# Patient Record
Sex: Female | Born: 1990 | Race: White | Hispanic: No | Marital: Single | Smoking: Never smoker
Health system: Southern US, Community
[De-identification: ages and names within clinical notes are randomized; demographics above are authoritative.]

## PROBLEM LIST (undated history)

## (undated) DIAGNOSIS — G43909 Migraine, unspecified, not intractable, without status migrainosus: Secondary | ICD-10-CM

---

## 2014-09-03 ENCOUNTER — Emergency Department (HOSPITAL_COMMUNITY)
Admission: EM | Admit: 2014-09-03 | Discharge: 2014-09-03 | Disposition: A | Discharge status: Home / Self Care | Payer: BLUE CROSS/BLUE SHIELD | Attending: Emergency Medicine | Admitting: Emergency Medicine

## 2014-09-03 ENCOUNTER — Encounter (HOSPITAL_COMMUNITY): Payer: Self-pay

## 2014-09-03 DIAGNOSIS — R51 Headache: Secondary | ICD-10-CM | POA: Diagnosis present

## 2014-09-03 DIAGNOSIS — H53149 Visual discomfort, unspecified: Secondary | ICD-10-CM | POA: Insufficient documentation

## 2014-09-03 DIAGNOSIS — G43009 Migraine without aura, not intractable, without status migrainosus: Secondary | ICD-10-CM | POA: Diagnosis not present

## 2014-09-03 HISTORY — DX: Migraine, unspecified, not intractable, without status migrainosus: G43.909

## 2014-09-03 LAB — CBC WITH DIFFERENTIAL/PLATELET
BASOS PCT: 0 % (ref 0–1)
Basophils Absolute: 0 10*3/uL (ref 0.0–0.1)
EOS PCT: 2 % (ref 0–5)
Eosinophils Absolute: 0.2 10*3/uL (ref 0.0–0.7)
HCT: 39.1 % (ref 36.0–46.0)
Hemoglobin: 13.1 g/dL (ref 12.0–15.0)
Lymphocytes Relative: 41 % (ref 12–46)
Lymphs Abs: 3.3 10*3/uL (ref 0.7–4.0)
MCH: 27.1 pg (ref 26.0–34.0)
MCHC: 33.5 g/dL (ref 30.0–36.0)
MCV: 81 fL (ref 78.0–100.0)
Monocytes Absolute: 0.7 10*3/uL (ref 0.1–1.0)
Monocytes Relative: 8 % (ref 3–12)
Neutro Abs: 4 10*3/uL (ref 1.7–7.7)
Neutrophils Relative %: 49 % (ref 43–77)
Platelets: 159 10*3/uL (ref 150–400)
RBC: 4.83 MIL/uL (ref 3.87–5.11)
RDW: 12.3 % (ref 11.5–15.5)
WBC: 8 10*3/uL (ref 4.0–10.5)

## 2014-09-03 LAB — BASIC METABOLIC PANEL
ANION GAP: 6 (ref 5–15)
BUN: 10 mg/dL (ref 6–20)
CALCIUM: 8.8 mg/dL — AB (ref 8.9–10.3)
CO2: 24 mmol/L (ref 22–32)
CREATININE: 0.84 mg/dL (ref 0.44–1.00)
Chloride: 107 mmol/L (ref 101–111)
GFR calc Af Amer: >60 mL/min (ref >60)
GFR calc non Af Amer: >60 mL/min (ref >60)
GLUCOSE: 84 mg/dL (ref 65–99)
Potassium: 3.7 mmol/L (ref 3.5–5.1)
Sodium: 137 mmol/L (ref 135–145)

## 2014-09-03 MED ORDER — METHOCARBAMOL 500 MG PO TABS: 500.0000 mg | ORAL_TABLET | Freq: Two times a day (BID) | ORAL | Status: AC

## 2014-09-03 MED ORDER — SODIUM CHLORIDE 0.9 % IV BOLUS (SEPSIS)
1000.0000 mL | Freq: Once | INTRAVENOUS | Status: AC
  Administered 2014-09-03: 1000 mL via INTRAVENOUS

## 2014-09-03 MED ORDER — ONDANSETRON 4 MG PO TBDP: 4.0000 mg | ORAL_TABLET | Freq: Three times a day (TID) | ORAL | Status: AC | PRN

## 2014-09-03 MED ORDER — KETOROLAC TROMETHAMINE 30 MG/ML IJ SOLN
30.0000 mg | Freq: Once | INTRAMUSCULAR | Status: AC
  Administered 2014-09-03: 30 mg via INTRAVENOUS
  Filled 2014-09-03: qty 1

## 2014-09-03 MED ORDER — METOCLOPRAMIDE HCL 5 MG/ML IJ SOLN
10.0000 mg | Freq: Once | INTRAMUSCULAR | Status: AC
  Administered 2014-09-03: 10 mg via INTRAVENOUS
  Filled 2014-09-03: qty 2

## 2014-09-03 MED ORDER — HYDROCODONE-ACETAMINOPHEN 5-325 MG PO TABS
2.0000 | ORAL_TABLET | ORAL | Status: AC | PRN
Start: 2014-09-03 — End: ?

## 2014-09-03 NOTE — ED Provider Notes (Signed)
CSN: [id number]     Arrival date & time 09/03/14  0607 History   First MD Initiated Contact with Patient 09/03/14 [phone]     Chief Complaint  Patient presents with  . Migraine     (Consider location/radiation/quality/duration/timing/severity/associated sxs/prior Treatment) HPI Comments: Patient is a 24 year old female with a past medical history of migraines who presents with a headache for 2 weeks. Patient reports a gradual onset and progressive worsening of the headache. The pain is aching, constant and is located in her right trapezius with radiation up the back of her head. Patient has tried OTC medication for symptoms without relief. No alleviating/aggravating factors. Patient reports associated nausea and photophobia. Patient denies fever, vomiting, diarrhea, numbness/tingling, weakness, visual changes, congestion, chest pain, SOB, abdominal pain.     Patient is a 24 y.o. female presenting with migraines.  Migraine Associated symptoms include headaches. Pertinent negatives include no abdominal pain, arthralgias, chest pain, chills, fatigue, fever, nausea, neck pain, vomiting or weakness.    Past Medical History  Diagnosis Date  . Migraines    History reviewed. No pertinent past surgical history. No family history on file. History  Substance Use Topics  . Smoking status: Never Smoker   . Smokeless tobacco: Never Used  . Alcohol Use: No   OB History    No data available     Review of Systems  Constitutional: Negative for fever, chills and fatigue.  HENT: Negative for trouble swallowing.   Eyes: Negative for visual disturbance.  Respiratory: Negative for shortness of breath.   Cardiovascular: Negative for chest pain and palpitations.  Gastrointestinal: Negative for nausea, vomiting, abdominal pain and diarrhea.  Genitourinary: Negative for dysuria and difficulty urinating.  Musculoskeletal: Negative for arthralgias and neck pain.  Skin: Negative for color change.   Neurological: Positive for headaches. Negative for dizziness and weakness.  Psychiatric/Behavioral: Negative for dysphoric mood.      Allergies  Other  Home Medications   Prior to Admission medications   Medication Sig Start Date End Date Taking? Authorizing Provider  ibuprofen (ADVIL,MOTRIN) 200 MG tablet Take 400 mg by mouth every 6 (six) hours as needed.   Yes Historical Provider, MD   BP 110/90 mmHg  Pulse 88  Temp(Src) 98.7 F (37.1 C)  Resp 12  Ht  (1.626 m)  Wt 160 lb (72.576 kg)  BMI 27.45 kg/m2  SpO2 100%  LMP 09/01/2014 Physical Exam  Constitutional: She is oriented to person, place, and time. She appears well-developed and well-nourished. No distress.  HENT:  Head: Normocephalic and atraumatic.  Mouth/Throat: Oropharynx is clear and moist. No oropharyngeal exudate.  Eyes: Conjunctivae and EOM are normal. Pupils are equal, round, and reactive to light.  Neck: Normal range of motion.  Cardiovascular: Normal rate and regular rhythm.  Exam reveals no gallop and no friction rub.   No murmur heard. Pulmonary/Chest: Effort normal and breath sounds normal. She has no wheezes. She has no rales. She exhibits no tenderness.  Abdominal: Soft. She exhibits no distension. There is no tenderness. There is no rebound.  Musculoskeletal: Normal range of motion.  Neurological: She is alert and oriented to person, place, and time. No cranial nerve deficit. Coordination normal.  Extremity strength and sensation equal and intact bilaterally. Speech is goal-oriented. Moves limbs without ataxia.   Skin: Skin is warm and dry.  Psychiatric: She has a normal mood and affect. Her behavior is normal.  Nursing note and vitals reviewed.   ED Course  Procedures (including critical  care time) Labs Review Labs Reviewed  BASIC METABOLIC PANEL - Abnormal; Notable for the following:    Calcium 8.8 (*)    All other components within normal limits  CBC WITH DIFFERENTIAL/PLATELET     Imaging Review No results found.   EKG Interpretation None      MDM   Final diagnoses:  Migraine without aura and without status migrainosus, not intractable    7:03 AM Labs pending. Patient will have fluids, toradol, reglan. Patient does not want benadryl due to "makes her anxious." Vitals stable and patient afebrile. No neuro deficits.   Patient's headache improved and patient will be discharged.   Emilia BeckKaitlyn Claudy Abdallah, PA-C 09/05/14 [id number]  Doug SouSam Jacubowitz, MD 09/05/14 [phone]

## 2014-09-03 NOTE — ED Notes (Signed)
Pt states that she has hx of migraines, has been getting worse in the past 2 weeks, today is the worst she has ever felt and does not feel like a migraine. Pain on left side of head and neck. C/o nausea, denies photophobia

## 2014-09-03 NOTE — Discharge Instructions (Signed)
Take Vicodin as needed for pain. Take Robaxin as needed for muscle spasm. Take zofran as needed for nausea. Refer to attached documents for more information. [facility] with [facility] as needed.

## 2014-09-03 NOTE — ED Notes (Signed)
Pt. Independently ambulatory to bathroom.

## 2014-10-16 ENCOUNTER — Other Ambulatory Visit: Payer: Self-pay | Admitting: Otolaryngology

## 2014-10-16 ENCOUNTER — Ambulatory Visit
Admission: RE | Admit: 2014-10-16 | Discharge: 2014-10-16 | Disposition: A | Discharge status: Home / Self Care | Payer: BLUE CROSS/BLUE SHIELD | Source: Ambulatory Visit | Attending: Otolaryngology | Admitting: Otolaryngology

## 2014-10-16 DIAGNOSIS — J329 Chronic sinusitis, unspecified: Secondary | ICD-10-CM

## 2015-06-18 ENCOUNTER — Ambulatory Visit: Payer: PRIVATE HEALTH INSURANCE | Attending: Family Medicine

## 2015-06-18 DIAGNOSIS — M62838 Other muscle spasm: Secondary | ICD-10-CM | POA: Diagnosis present

## 2015-06-18 DIAGNOSIS — M545 Low back pain, unspecified: Secondary | ICD-10-CM

## 2015-06-18 DIAGNOSIS — R6889 Other general symptoms and signs: Secondary | ICD-10-CM | POA: Diagnosis present

## 2015-06-18 DIAGNOSIS — M62830 Muscle spasm of back: Secondary | ICD-10-CM | POA: Diagnosis present

## 2015-06-18 NOTE — Therapy (Signed)
[facility] [address] [place], [place], [address] Phone: [phone]   Fax:  [phone]  Physical Therapy Evaluation  Patient Details  Name: Karen Rowe MRN: [id number] Date of Birth: March 17, 1991 Referring Provider: Lanell Persons, MD  Encounter Date: 06/18/2015      PT End of Session - 06/18/15 1422    Visit Number 1   Number of Visits 8   Date for PT Re-Evaluation 07/16/15   Authorization Type Worker compensation   Authorization - Visit Number 1   Authorization - Number of Visits 8   PT Start Time 0135   PT Stop Time 0215   PT Time Calculation (min) 40 min   Activity Tolerance Patient tolerated treatment well;Patient limited by pain   Behavior During Therapy [facility] for tasks assessed/performed      Past Medical History  Diagnosis Date  . Migraines     No past surgical history on file.  There were no vitals filed for this visit.  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    Limitations Sitting  lying down   How long can you sit comfortably? 2 min   How long can you stand comfortably? as needed no pain for 10-15 min.    How long can you walk comfortably? best  20 miin before pain starts.    Diagnostic tests none   Patient Stated Goals No leg pain , return to work.    Currently in Pain? Yes   Pain Score 6    Pain Location Hip  and back   Pain Orientation Right;Left;Posterior   Pain Descriptors / Indicators Throbbing;Tightness   Pain Type Acute pain   Pain Radiating Towards both hips  posterior to anterior thighs to knees   Pain Onset More than a month ago   Pain Frequency Intermittent  AM no pain on waking   Aggravating Factors  sittiing and lying most but can come on after prolonged period s   Pain Relieving Factors changeing positions . heat helps back but increases hip and leg pain   Multiple Pain Sites No            [facility] PT Assessment - 06/18/15 1345    Assessment   Medical Diagnosis lumbar strain   Referring Provider Lanell Persons, MD   Onset Date/Surgical Date --  4 weeks ago   Next MD Visit 07/03/15   Prior Therapy No   Precautions   Precaution Comments She reports light duty   Restrictions   Weight Bearing Restrictions No   Balance Screen   Has the patient fallen in the past 6 months No   Has the patient had a decrease in activity level because of a fear of falling?  Yes   Is the patient reluctant to leave their home because of a fear of falling?  No   Prior Function   Level of Independence Independent   Vocation Full time employment   Vocation Requirements Up to 100 pounds   Cognition   Overall Cognitive  Status Within Functional Limits for tasks assessed   Observation/Other Assessments   Focus on Therapeutic Outcomes (FOTO)  53% limited   ROM / Strength   AROM / PROM / Strength AROM;Strength   AROM   AROM Assessment Site Lumbar   Lumbar Flexion Normal   Lumbar Extension 35   Lumbar - Right Side Bend 32   Lumbar - Left Side Bend 31    Tender sacrum and lower lumbar spine and may have some LT sacral rotation                       PT Education - 06/18/15 1431    Education provided Yes   Education Details POC, HEP   Person(s) Educated Patient   Methods Explanation;Tactile cues;Verbal cues;Handout   Comprehension Returned demonstration;Verbalized understanding             PT Long Term Goals - 06/18/15 1428    PT LONG TERM GOAL #1   Title she will be independent with all HEP issued as of last visit.   Time 4    Period Weeks   Status New   PT LONG TERM GOAL #2   Title She will report leg pain decreased by 50% or more in intensity or frequency.    Time 4   Period Weeks   Status New   PT LONG TERM GOAL #3   Title She will report increased activity at work with minor incr back pain.    Time 4   Period Weeks   Status New   PT LONG TERM GOAL #4   Title She will be able to sit with support for 30 min or more without incr back and leg pain   Time 4   Period Weeks   Status New               Plan - 06/18/15 1423    Clinical Impression Statement Ms Karen Rowe presents withLBP and bilateral hip /buttock pain extneding to anterior thighs to knees. Sitting is most painful position but she cna have pain in back and legs with prolonged positioning in standing and walking . She is best early AM. She is limtied at work as Pharmacist, [employer]. She should improve with skilled PT if paincan stay centralized and she was cautioned to stop activity that cause extremity pain.    Pt will benefit from skilled therapeutic intervention in order to improve on the following deficits Decreased activity tolerance;Pain;Increased muscle spasms   Rehab Potential Good   PT Frequency 2x / week   PT Duration 4 weeks   PT Treatment/Interventions Electrical Stimulation;Iontophoresis 4mg /ml Dexamethasone;Ultrasound;Traction;Moist Heat;Cryotherapy;Therapeutic activities;Therapeutic exercise;Patient/family education;Manual techniques;Passive range of motion;Dry needling;Taping   PT Next Visit Plan REview HEP and progress stabilization exercises. Manual treatment and modalities as needed    PT Home Exercise Plan transverse abdominus, posure sitting with support, childs pose with lateral shift to LT as long as  no leg pain   Consulted and Agree with Plan of Care Patient         Problem List There are no active problems to display for this patient.   Karen Rowe, Karen Rowe  PT 06/18/2015, 2:37 PM  [facility] [address] [place], [place], [address] Phone: [phone]   Fax:  [phone]  Name: Karen Rowe MRN: [id number] Date of Birth: 1990-09-28

## 2015-06-18 NOTE — Patient Instructions (Signed)
From cabinet childs pose 2-3 reps 2-3x/day  10 reps shift to LT with hips , transverese abdominus and pelvic floor stab exercise with knee drop 12-x/day 10-20 reps

## 2015-06-20 ENCOUNTER — Ambulatory Visit: Payer: PRIVATE HEALTH INSURANCE | Attending: Family Medicine | Admitting: Physical Therapy

## 2015-06-20 DIAGNOSIS — M545 Low back pain, unspecified: Secondary | ICD-10-CM

## 2015-06-20 DIAGNOSIS — R6889 Other general symptoms and signs: Secondary | ICD-10-CM | POA: Insufficient documentation

## 2015-06-20 DIAGNOSIS — M62838 Other muscle spasm: Secondary | ICD-10-CM | POA: Diagnosis present

## 2015-06-20 DIAGNOSIS — M62830 Muscle spasm of back: Secondary | ICD-10-CM | POA: Diagnosis present

## 2015-06-20 NOTE — Therapy (Signed)
[facility] [address] [place], [place], [address] Phone: [phone]   Fax:  [phone]  Physical Therapy Treatment  Patient Details  Name: Karen Rowe MRN: [id number] Date of Birth: 18-Feb-1991 Referring Provider: Lanell Persons, MD  Encounter Date: 06/20/2015      PT End of Session - 06/20/15 1828    Visit Number 2   Number of Visits 8   Date for PT Re-Evaluation 07/16/15   PT Start Time 1105   PT Stop Time 1205   PT Time Calculation (min) 60 min   Activity Tolerance Patient tolerated treatment well   Behavior During Therapy [facility] for tasks assessed/performed      Past Medical History  Diagnosis Date  . Migraines     No past surgical history on file.  There were no vitals filed for this visit.  Visit Diagnosis:  Bilateral low back pain without sciatica  Activity intolerance  Muscle spasm of back  Muscle spasms of both lower extremities      Subjective Assessment - 06/20/15 1108    Subjective Hurting more than less visit.  First morning with AM pain first thing.  Less leg pain last night.  Woke up with leg pain 3/10 ,  6/10 now.  Better with sacral stretch in child's pose last night   Currently in Pain? Yes   Pain Score 6    Pain Location Back   Pain Orientation Right;Left;Posterior   Pain Descriptors / Indicators Throbbing                         [facility] Adult PT Treatment/Exercise - 06/20/15 0001    Self-Care   Self-Care --  Quadratus lumborum info,  location, scoot. areas of pain   Lumbar Exercises: Standing   Other Standing Lumbar Exercises Mckenzie lateral shift standing LT toward wall, sidelying RT.  Able to centralize pain, Time also spent discussing corrent exercise program   Cryotherapy   Number Minutes Cryotherapy 15 Minutes   Cryotherapy Location Lumbar Spine   Type of Cryotherapy --  cold pack   Electrical Stimulation   Electrical Stimulation Location lumbar   Electrical Stimulation Action IFC   Electrical Stimulation Parameters 8   Electrical Stimulation Goals Pain   Manual Therapy   Manual therapy comments soft tissue work,  quadratus lumborum strumming,  low back, gluteals.  piriformis etc.  Multipls sore areas.  Light pressure som moderate pressure                PT Education - 06/20/15 1824    Education provided Yes   Education Details Lateral techniques,  quadratus lumborum information.     Person(s) Educated Patient   Methods Explanation;Demonstration;Verbal cues;Handout   Comprehension Verbalized understanding;Returned demonstration             PT Long Term Goals - 06/18/15 1428    PT LONG TERM GOAL #1   Title she will be independent with all HEP issued as of last visit.   Time 4   Period Weeks   Status New   PT LONG TERM GOAL #2   Title She will report leg pain decreased by 50% or more in intensity or frequency.    Time 4   Period Weeks   Status New   PT LONG TERM GOAL #3   Title She will report increased activity at work with minor incr back pain.    Time 4   Period Weeks  Status New   PT LONG TERM GOAL #4   Title She will be able to sit with support for 30 min or more without incr back and leg pain   Time 4   Period Weeks   Status New               Plan - 06/20/15 1829    Clinical Impression Statement able to centralize pain with lateral shifting.  Sensitive areas multiple to soft tissue work.     PT Next Visit Plan when spine neutral stabilization.  Dry needle in future.  Manual and modalities.  ADL handout   PT Home Exercise Plan Lateral stretch   Consulted and Agree with Plan of Care Patient        Problem List There are no active problems to display for this patient.   [facility],KAREN 06/20/2015, 6:32 PM  [facility] [address] [place], [place], [address] Phone: [phone]   Fax:  [phone]  Name: Nils PyleJulia Payer MRN:  [id number] Date of Birth: 1990-12-07    Liz BeachKaren Harris, PTA 06/20/2015 6:32 PM Phone: [phone] Fax: [phone]

## 2015-06-20 NOTE — Patient Instructions (Signed)
From drawer:  Mckenzie lateral technique  3-5 X 10 seconds + longer as needed to correct posture.1st and last issued.

## 2015-07-01 ENCOUNTER — Ambulatory Visit: Payer: PRIVATE HEALTH INSURANCE | Admitting: Physical Therapy

## 2015-07-01 DIAGNOSIS — M545 Low back pain, unspecified: Secondary | ICD-10-CM

## 2015-07-01 DIAGNOSIS — M62830 Muscle spasm of back: Secondary | ICD-10-CM

## 2015-07-01 DIAGNOSIS — R6889 Other general symptoms and signs: Secondary | ICD-10-CM

## 2015-07-01 DIAGNOSIS — M62838 Other muscle spasm: Secondary | ICD-10-CM

## 2015-07-01 NOTE — Patient Instructions (Addendum)
Trigger Point Dry Needling  . What is Trigger Point Dry Needling (DN)? o DN is a physical therapy technique used to treat muscle pain and dysfunction. Specifically, DN helps deactivate muscle trigger points (muscle knots).  o A thin filiform needle is used to penetrate the skin and stimulate the underlying trigger point. The goal is for a local twitch response (LTR) to occur and for the trigger point to relax. No medication of any kind is injected during the procedure.   . What Does Trigger Point Dry Needling Feel Like?  o The procedure feels different for each individual patient. Some patients report that they do not actually feel the needle enter the skin and overall the process is not painful. Very mild bleeding may occur. However, many patients feel a deep cramping in the muscle in which the needle was inserted. This is the local twitch response.   Marland Kitchen. How Will I feel after the treatment? o Soreness is normal, and the onset of soreness may not occur for a few hours. Typically this soreness does not last longer than two days.  o Bruising is uncommon, however; ice can be used to decrease any possible bruising.  o In rare cases feeling tired or nauseous after the treatment is normal. In addition, your symptoms may get worse before they get better, this period will typically not last longer than 24 hours.   . What Can I do After My Treatment? o Increase your hydration by drinking more water for the next 24 hours. o You may place ice or heat on the areas treated that have become sore, however, do not use heat on inflamed or bruised areas. Heat often brings more relief post needling. o You can continue your regular activities, but vigorous activity is not recommended initially after the treatment for 24 hours. o DN is best combined with other physical therapy such as strengthening, stretching, and other therapies.   Piriformis (Supine)  Cross legs, right on top. Gently pull other knee toward chest  until stretch is felt in buttock/hip of top leg. Modified by placing right knee over left knee and bringing both to chest as shown in clinic( not right ankle over left knee NO Hold _30-60- ___ seconds. Repeat 2-3____ times per set. Do _1___ sets per session. Do _2-3___ sessions per day.  Hip Stretch  Put right ankle over left knee. Let right knee fall downward, but keep ankle in place. Feel the stretch in hip. May push down gently with hand to feel stretch. Hold 30-60____ seconds while counting out loud. Repeat with other leg. Repeat _2-3___ times. Do __2-3__ sessions per day. Total a day.          Garen LahLawrie Latrina Guttman, PT 07/01/2015 1:41 PM Phone: [phone] Fax: [phone]

## 2015-07-01 NOTE — Therapy (Signed)
[facility] [address] [place], [place], [address] Phone: [phone]   Fax:  [phone]  Physical Therapy Treatment  Patient Details  Name: Karen Rowe MRN: [id number] Date of Birth: 1990/04/29 Referring Provider: Lanell Persons, MD  Encounter Date: 07/01/2015      PT End of Session - 07/01/15 1415    Visit Number 3   Number of Visits 8   Date for PT Re-Evaluation 07/16/15   Authorization Type Worker compensation   PT Start Time 0135   PT Stop Time 0230   PT Time Calculation (min) 55 min   Activity Tolerance Patient tolerated treatment well   Behavior During Therapy [facility] for tasks assessed/performed      Past Medical History  Diagnosis Date  . Migraines     No past surgical history on file.  There were no vitals filed for this visit.  Visit Diagnosis:  Bilateral low back pain without sciatica  Activity intolerance  Muscle spasm of back  Muscle spasms of both lower extremities      Subjective Assessment - 07/01/15 1333    Subjective I really like the lateral shifting exericises really help me.  I am interested in the trigger point dry needling.  A day after my PT session my back pain was consistenly better in the back but today my right leg pain in worse   Limitations Sitting   How long can you sit comfortably? 10 minutes   How long can you stand comfortably? 20-30 minutes less pain   How long can you walk comfortably? best  20 miin before pain starts.    Diagnostic tests none   Patient Stated Goals No leg pain , return to work.    Currently in Pain? Yes   Pain Score 2    Pain Location Back   Pain Orientation Right;Posterior   Pain Descriptors / Indicators Tightness   Pain Type Chronic pain   Pain Onset More than a month ago   Pain Frequency Intermittent   Pain Score 5  yesterday is a 10/10   Pain Location Leg   Pain Descriptors / Indicators Aching;Radiating   Pain Type Chronic pain   Pain  Onset More than a month ago   Pain Frequency Intermittent            [facility] PT Assessment - 07/01/15 1344    Posture/Postural Control   Posture/Postural Control Postural limitations   Posture Comments Pt with eleveated pelvic level on right and anterior rotation. on right   Palpation   Palpation comment trigger point pain over Right quadratus, glueteal line and piriformis                     [facility] Adult PT Treatment/Exercise - 07/01/15 1344    Self-Care   Self-Care Other Self-Care Comments   Other Self-Care Comments  use of LAX ball for self myofascial stretch and explanaition of dry needling , aftercare and precautians   Lumbar Exercises: Stretches   Piriformis Stretch 3 reps;30 seconds  sitting stretch   Piriformis Stretch Limitations 2 reps in supine with right knee crossed over left   Lumbar Exercises: Sidelying   Other Sidelying Lumbar Exercises left sidelying quadratus lumborum with pillow under torso for 5 min    Moist Heat Therapy   Number Minutes Moist Heat 15 Minutes   Moist Heat Location Hip;Lumbar Spine  right quadratus and right piriformis   Manual Therapy   Manual Therapy Soft tissue mobilization;Myofascial release;Passive  ROM   Soft tissue mobilization Piriformis and Gluteals on right with IASTYM tool    Myofascial Release Right quadratus lumborum in left sidelying   Passive ROM prone assisted stretch with myofascial release of Piriformis  wit IIR/ER stretch at end range of Right           Trigger Point Dry Needling - 07/01/15 1418    Consent Given? Yes   Education Handout Provided Yes   Muscles Treated Lower Body Gluteus maximus;Gluteus minimus;Piriformis  quadratus Lumborum right twitch response   Gluteus Maximus Response Twitch response elicited;Palpable increased muscle length   Gluteus Minimus Response Twitch response elicited;Palpable increased muscle length   Piriformis Response Twitch response elicited;Palpable increased muscle length               PT Education - 07/01/15 1342    Education provided Yes   Education Details Qaudratus lumborum stretch review, piriformis stretch modification and trigger point dry needling    Person(s) Educated Patient   Methods Explanation;Demonstration;Verbal cues   Comprehension Verbalized understanding;Returned demonstration;Tactile cues required;Verbal cues required             PT Long Term Goals - 07/01/15 1641    PT LONG TERM GOAL #1   Title she will be independent with all HEP issued as of last visit.   Time 4   Period Weeks   Status On-going   PT LONG TERM GOAL #2   Title She will report leg pain decreased by 50% or more in intensity or frequency.    Time 4   Period Weeks   Status On-going   PT LONG TERM GOAL #3   Title She will report increased activity at work with minor incr back pain.    Time 4   Period Weeks   Status On-going   PT LONG TERM GOAL #4   Title She will be able to sit with support for 30 min or more without incr back and leg pain   Time 4   Period Weeks   Status On-going               Plan - 07/01/15 1417    Clinical Impression Statement Pt presents with right Quadratus Lumborum and right piriformis trigger point pain and consents to trigger poing dry needling . Pt also given instruction about using LAX ball for piriformis stretch on Rgith side. Pt was able to perform Piriformis stretch before and after dry needling.  Pt was monitored throughtout dry needling session.  Pt was able to perform  piriformins stretch with 50% decreased  discomfort.  Pt will  be assessed next  session for long term decrease in pain.  Pt was able to verbalize aftercare and precautians for trigger point dry needling.     Pt will benefit from skilled therapeutic intervention in order to improve on the following deficits Decreased activity tolerance;Pain;Increased muscle spasms   Rehab Potential Good   PT Frequency 2x / week   PT Duration 4 weeks   PT  Treatment/Interventions Electrical Stimulation;Iontophoresis /ml Dexamethasone;Ultrasound;Traction;Moist Heat;Cryotherapy;Therapeutic activities;Therapeutic exercise;Patient/family education;Manual techniques;Passive range of motion;Dry needling;Taping   PT Next Visit Plan Assess effectiveness of dry needling, assess FABER and pain add to HEP as necessary, modalities as needed   PT Home Exercise Plan Quadratus lumborum , Modified Piriformis stretch.   Consulted and Agree with Plan of Care Patient        Problem List There are no active problems to display for this patient.  Garen Lah, PT  07/01/2015 4:44 PM Phone: [phone] Fax: [phone]  [facility] [address] [place], [place], [address] Phone: [phone]   Fax:  [phone]  Name: Karen Rowe MRN: [id number] Date of Birth: Oct 21, 1990

## 2015-07-03 ENCOUNTER — Ambulatory Visit: Payer: PRIVATE HEALTH INSURANCE | Admitting: Physical Therapy

## 2015-07-03 DIAGNOSIS — M545 Low back pain, unspecified: Secondary | ICD-10-CM

## 2015-07-03 DIAGNOSIS — M62838 Other muscle spasm: Secondary | ICD-10-CM

## 2015-07-03 DIAGNOSIS — M62830 Muscle spasm of back: Secondary | ICD-10-CM

## 2015-07-03 DIAGNOSIS — R6889 Other general symptoms and signs: Secondary | ICD-10-CM

## 2015-07-03 NOTE — Therapy (Signed)
[facility] [address] [place], [place], [address] Phone: [phone]   Fax:  [phone]  Physical Therapy Treatment  Patient Details  Name: Karen PyleJulia Rowe MRN: [id number] Date of Birth: 1990-06-12 Referring Provider: Lanell Personshomas Kingsley, MD  Encounter Date: 07/03/2015      PT End of Session - 07/03/15 0944    Visit Number 4   Number of Visits 8   Date for PT Re-Evaluation 07/16/15   PT Start Time 0852   PT Stop Time 0935   PT Time Calculation (min) 43 min   Activity Tolerance Patient tolerated treatment well   Behavior During Therapy [facility] for tasks assessed/performed      Past Medical History  Diagnosis Date  . Migraines     No past surgical history on file.  There were no vitals filed for this visit.  Visit Diagnosis:  Bilateral low back pain without sciatica  Activity intolerance  Muscle spasm of back  Muscle spasms of both lower extremities      Subjective Assessment - 07/03/15 0854    Subjective DN  Helped her sit longer  Can sit 10 minutes. ,  and it did help the soreness in legs for 1 day and most of yesterday,     Currently in Pain? Yes   Pain Score 3    Pain Location Back   Pain Orientation Right   Pain Descriptors / Indicators Tightness;Throbbing   Pain Radiating Towards RT leg mild   Aggravating Factors  sitting more than 10 minutes.                          [facility] Adult PT Treatment/Exercise - 07/03/15 0001    Posture/Postural Control   Posture Comments slight anterior rotation RT.    Lumbar Exercises: Stretches   Passive Hamstring Stretch 3 reps;30 seconds   Passive Hamstring Stretch Limitations BOTH  80 degrees RT,  LT  90   Piriformis Stretch 3 reps  20 seconds  sitting.    Lumbar Exercises: Supine   Clam Limitations isometric with belt 10 X,, also ball squeeze 10 X   Bridge 10 reps  one bone at a time helped pain   Isometric Hip Flexion 10 reps  arm Rt/LT pushing ball    Large Ball Oblique Isometric 10 reps  each   Manual Therapy   Manual Therapy Soft tissue mobilization   Soft tissue mobilization Piriformis and Gluteals on right with IASTYM tool   han hands used intermittantly.  Mid back tender paraspinals    Myofascial Release Right quadratus lumborum in left sidelying  and paraspinals                PT Education - 07/03/15 0943    Education provided Yes   Education Details Bridge, hamstring stretch   Person(s) Educated Patient   Methods Explanation;Handout;Tactile cues;Verbal cues   Comprehension Verbalized understanding;Returned demonstration             PT Long Term Goals - 07/01/15 1641    PT LONG TERM GOAL #1   Title she will be independent with all HEP issued as of last visit.   Time 4   Period Weeks   Status On-going   PT LONG TERM GOAL #2   Title She will report leg pain decreased by 50% or more in intensity or frequency.    Time 4   Period Weeks   Status On-going   PT LONG TERM GOAL #  3   Title She will report increased activity at work with minor incr back pain.    Time 4   Period Weeks   Status On-going   PT LONG TERM GOAL #4   Title She will be able to sit with support for 30 min or more without incr back and leg pain   Time 4   Period Weeks   Status On-going               Plan - 07/03/15 0944    Clinical Impression Statement Patient has a period of time since last few days where she had no pain.  "It was a miracle".   She relates [place] been painful.  She has an unstable feeling in SI when standing single leg on RT only.   Was able to add 2 new home exercises  today.  Pain 2/10 at end of session.    PT Next Visit Plan assess FABER and pain , stabilization exercise progression.  Check for IT band tightness,  calf stretch.  Cat Camel?   PT Home Exercise Plan hamstring stretch, bridge   Consulted and Agree with Plan of Care Patient        Problem List There are no  active problems to display for this patient.   [facility] 07/03/2015, 9:50 AM  [facility] [address] [place], [place], [address] Phone: [phone]   Fax:  [phone]  Name: Karen Rowe MRN: [id number] Date of Birth: 10/20/1990    Liz Beach, PTA 07/03/2015 9:50 AM Phone: [phone] Fax: [phone]

## 2015-07-03 NOTE — Patient Instructions (Addendum)
Hamstring: Towel Stretch (Supine)    Lie on back. Loop towel around left foot, hip and knee at 90. Straighten knee and pull foot toward body. Hold __30_ seconds. Relax. Repeat 3___ times. Do _1Bridge    Lie back, legs bent. Inhale, pressing hips up. Keeping ribs in, lengthen lower back. Exhale, rolling down along spine from top. Repeat _10_-20__ times. Do _1___ sessions per day.  http://pm.exer.us/55   Copyright  VHI. All rights reserved.  _1_ times a day. Repeat with other leg.    Copyright  VHI. All rights reserved.

## 2015-07-08 ENCOUNTER — Ambulatory Visit: Payer: PRIVATE HEALTH INSURANCE | Admitting: Physical Therapy

## 2015-07-08 DIAGNOSIS — M62838 Other muscle spasm: Secondary | ICD-10-CM

## 2015-07-08 DIAGNOSIS — R6889 Other general symptoms and signs: Secondary | ICD-10-CM

## 2015-07-08 DIAGNOSIS — M62830 Muscle spasm of back: Secondary | ICD-10-CM

## 2015-07-08 DIAGNOSIS — M545 Low back pain, unspecified: Secondary | ICD-10-CM

## 2015-07-08 NOTE — Therapy (Signed)
[place], [place], [address] Phone: [phone]   Fax:  [phone]  Physical Therapy Treatment  Patient Details  Name: Karen Rowe MRN: [id number] Date of Birth: 02-15-1991 Referring Provider: Odis Luster, MD  Encounter Date: 07/08/2015      PT End of Session - 07/08/15 0907    Visit Number 5   Number of Visits 8   Date for PT Re-Evaluation 07/16/15   Authorization Type Worker compensation   Authorization - Visit Number 5   Authorization - Number of Visits 8   PT Start Time 9150   PT Stop Time 0946   PT Time Calculation (min) 53 min      Past Medical History  Diagnosis Date  . Migraines     No past surgical history on file.  There were no vitals filed for this visit.  Visit Diagnosis:  Bilateral low back pain without sciatica  Activity intolerance  Muscle spasm of back  Muscle spasms of both lower extremities      Subjective Assessment - 07/08/15 0854    Subjective Back pain was worse after last session but leg pain was better. Long car ride over the weekend 3.5 hours each way aggravated the leg pain.    Currently in Pain? Yes   Pain Score 2    Pain Location Buttocks   Pain Orientation Right   Pain Descriptors / Indicators --  someone is jabbing and turning something   Pain Radiating Towards rt anterior thigh   Aggravating Factors  sitting                         [facility] Adult PT Treatment/Exercise - 07/08/15 0001    Lumbar Exercises: Stretches   Active Hamstring Stretch 3 reps;30 seconds   Active Hamstring Stretch Limitations with strap   Piriformis Stretch 3 reps;30 seconds   Piriformis Stretch Limitations figure four post manual   Moist Heat Therapy   Number Minutes Moist Heat 15 Minutes   Moist Heat Location Hip;Lumbar Spine  right quadratus and right piriformis   Manual Therapy   Manual Therapy Soft tissue mobilization   Soft tissue mobilization  Piriformis and sacral border right with PROM Hip IR/ ER                     PT Long Term Goals - 07/08/15 0908    PT LONG TERM GOAL #1   Title she will be independent with all HEP issued as of last visit.   Time 4   Period Weeks   PT LONG TERM GOAL #2   Title She will report leg pain decreased by 50% or more in intensity or frequency.    Baseline 20%   Time 4   Period Weeks   Status On-going   PT LONG TERM GOAL #3   Title She will report increased activity at work with minor incr back pain.    Time 4   Period Weeks   Status Achieved   PT LONG TERM GOAL #4   Title She will be able to sit with support for 30 min or more without incr back and leg pain   Baseline 10 minutes, some days less   Time 4   Period Weeks   Status On-going               Plan - 07/08/15 0910    Clinical Impression Statement Pt would like to  try TPDN one more visit. Scheduled pt for next week. She may benefit from additional visits. Instructed pt in piriformis, hamstring and cat /camel stretching per last visit plan. Pt reports a 20 % overall decrease in lumbar and leg pain. She is limited to 10 minutes of sitting prior to increased pain. She can report an increase in activity at work with only mild increase in lumbar and back pain.LTG#3 Met. Manual TPR used  in prone to lengthen piriformis as well as active release using Hip IR/AROM PROM Rt. Pt able to tolerate figure 4 stretching more comfortably post session.    PT Next Visit Plan REQUEST MORE VISITS? assess FABER and pain , stabilization exercise progression.  Check for IT band tightness, review hamstring stretch, add calf stretch?         Problem List There are no active problems to display for this patient.   Hessie Diener Zena, [place] 07/08/2015, 9:58 AM  [place], [place], [address] Phone: [phone]   Fax:  [phone]  Name: Karen Rowe MRN:  [id number] Date of Birth: Dec 02, 1990

## 2015-07-10 ENCOUNTER — Ambulatory Visit: Payer: PRIVATE HEALTH INSURANCE | Admitting: Physical Therapy

## 2015-07-10 DIAGNOSIS — M545 Low back pain, unspecified: Secondary | ICD-10-CM

## 2015-07-10 DIAGNOSIS — M62838 Other muscle spasm: Secondary | ICD-10-CM

## 2015-07-10 DIAGNOSIS — M62830 Muscle spasm of back: Secondary | ICD-10-CM

## 2015-07-10 DIAGNOSIS — R6889 Other general symptoms and signs: Secondary | ICD-10-CM

## 2015-07-10 NOTE — Therapy (Signed)
[place], [place], [address] Phone: [phone]   Fax:  [phone]  Physical Therapy Treatment  Patient Details  Name: Karen Rowe MRN: [id number] Date of Birth: 1990/08/08 Referring Provider: Odis Luster, MD  Encounter Date: 07/10/2015      PT End of Session - 07/10/15 0927    Visit Number 6   Number of Visits 8   Date for PT Re-Evaluation 07/16/15   Authorization - Visit Number 6   Authorization - Number of Visits 8   PT Start Time 7867   PT Stop Time 0937   PT Time Calculation (min) 42 min   Activity Tolerance Patient tolerated treatment well   Behavior During Therapy [facility] for tasks assessed/performed      Past Medical History  Diagnosis Date  . Migraines     No past surgical history on file.  There were no vitals filed for this visit.  Visit Diagnosis:  Bilateral low back pain without sciatica  Activity intolerance  Muscle spasm of back  Muscle spasms of both lower extremities      Subjective Assessment - 07/10/15 0856    Subjective 6/10.  Helped patient yesterday with arms outstretched. and felt a small twinge and then it progressed. Did not sleep with 9/10 pain.  Both legs to back.  Past knee. I feel like my posture is getting better   Currently in Pain? Yes   Pain Score 6   up to 9/10   Pain Location Back   Pain Orientation Right;Left   Pain Descriptors / Indicators Tingling;Numbness  paraesthesia wierd, new below knees.   Pain Radiating Towards both legs below knees   Pain Frequency Constant   Aggravating Factors  helping patient   Pain Relieving Factors quadratus stretch a little                         [facility] Adult PT Treatment/Exercise - 07/10/15 0903    Lumbar Exercises: Aerobic   Tread Mill 2.5 minutes prior to pain down RT leg increased.   Rt leg felt like it was working harder than LT   Traction   Type of Traction Lumbar   Min (lbs) 20   Max (lbs) 50   Hold Time Protocol for disc involvement with guarding   Time 17                     PT Long Term Goals - 07/08/15 0908    PT LONG TERM GOAL #1   Title she will be independent with all HEP issued as of last visit.   Time 4   Period Weeks   PT LONG TERM GOAL #2   Title She will report leg pain decreased by 50% or more in intensity or frequency.    Baseline 20%   Time 4   Period Weeks   Status On-going   PT LONG TERM GOAL #3   Title She will report increased activity at work with minor incr back pain.    Time 4   Period Weeks   Status Achieved   PT LONG TERM GOAL #4   Title She will be able to sit with support for 30 min or more without incr back and leg pain   Baseline 10 minutes, some days less   Time 4   Period Weeks   Status On-going  Plan - 07/10/15 0928    Clinical Impression Statement Pain into both legs now below knees which is new , see subjective.   PT Pearson Forster was  informed for worsening so he discussed Return to MD.  Trial of traction. No pain goals met.   PT Next Visit Plan REQUEST MORE VISITS? assess FABER and pain , stabilization exercise progression.  Check for IT band tightness, review hamstring stretch, add calf stretch?    PT Home Exercise Plan No new   Consulted and Agree with Plan of Care Patient        Problem List There are no active problems to display for this patient.   [facility] 07/10/2015, 1:19 PM  [facility] [address] [place], [place], [address] Phone: [phone]   Fax:  [phone]  Name: Karen Rowe MRN: [id number] Date of Birth: [birth year]    Melvenia Needles, PTA 07/10/2015 1:19 PM Phone: [phone] Fax: [phone]

## 2015-07-17 ENCOUNTER — Ambulatory Visit: Payer: PRIVATE HEALTH INSURANCE | Attending: Family Medicine

## 2015-07-17 DIAGNOSIS — R252 Cramp and spasm: Secondary | ICD-10-CM | POA: Insufficient documentation

## 2015-07-17 DIAGNOSIS — M5442 Lumbago with sciatica, left side: Secondary | ICD-10-CM | POA: Insufficient documentation

## 2015-07-17 DIAGNOSIS — M5441 Lumbago with sciatica, right side: Secondary | ICD-10-CM | POA: Insufficient documentation

## 2015-07-17 NOTE — Therapy (Addendum)
[place], [place], [address] Phone: [phone]   Fax:  [phone]  Physical Therapy Treatment  Patient Details  Name: Karen Rowe MRN: [id number] Date of Birth: May 28, 1990 Referring Provider: Odis Luster, MD  Encounter Date: 07/17/2015      PT End of Session - 07/17/15 0909    Visit Number 7   Number of Visits 12   Date for PT Re-Evaluation 08/09/15   PT Start Time 3794   PT Stop Time 0930   PT Time Calculation (min) 43 min   Activity Tolerance Patient tolerated treatment well;No increased pain   Behavior During Therapy [facility] for tasks assessed/performed      Past Medical History  Diagnosis Date  . Migraines     No past surgical history on file.  There were no vitals filed for this visit.  Visit Diagnosis:  Bilateral low back pain with sciatica, sciatica laterality unspecified - Plan: PT plan of care cert/re-cert  Cramp and spasm - Plan: PT plan of care cert/re-cert      Subjective Assessment - 07/17/15 0850    Subjective 8/10 back and both leg RT to foot and LT to knee. We discussed what parts of PT were beneficial and she reported traction makes back better but legs unchanged and dry needling gave most benefit. She saw MD this week and he wanted 2 more weeks of PT and make descision of next step then.    Currently in Pain? Yes   Pain Score 8    Pain Location Back   Pain Orientation Right;Left;Lower;Posterior   Pain Descriptors / Indicators Tingling;Numbness   Pain Type Acute pain   Pain Radiating Towards both legs   Pain Onset More than a month ago   Pain Frequency Constant   Multiple Pain Sites No                         [facility] Adult PT Treatment/Exercise - 07/17/15 0001    Traction   Type of Traction Lumbar   Min (lbs) 25   Max (lbs) 75   Hold Time Protocol for disc involvement with guarding   Time 17   Manual Therapy   Passive ROM hamstring stretch x 2  RT and LT without decr symptoms in leg No increase either.      HMP to lower back at end                PT Long Term Goals - 07/17/15 0915    PT LONG TERM GOAL #1   Title she will be independent with all HEP issued as of last visit.   Status On-going   PT LONG TERM GOAL #2   Title She will report leg pain decreased by 50% or more in intensity or frequency.    Status On-going   PT LONG TERM GOAL #3   Title She will report increased activity at work with minor incr back pain.    Status Achieved   PT LONG TERM GOAL #4   Title She will be able to sit with support for 30 min or more without incr back and leg pain   Status On-going               Plan - 07/17/15 0909    Clinical Impression Statement It appears dry needling has had most benefit. She is consistent per her report in doing exercise s/posture/ work tasks. Traction helps back pain but  not leg pain.    Pt will benefit from skilled therapeutic intervention in order to improve on the following deficits Pain;Decreased activity tolerance;Increased muscle spasms  bilateral LBP with sciatic unspecified, cramp and spasm   PT Next Visit Plan Dry needling and traction for 3-4 more visits  then return to MD  progress stabilization if appropriate   Consulted and Agree with Plan of Care Patient        Problem List There are no active problems to display for this patient.   Darrel Hoover PT 07/17/2015, 9:18 AM  [facility] [address] [place], [place], [address] Phone: [phone]   Fax:  [phone]  Name: Karen Rowe MRN: [id number] Date of Birth: 1990/06/03    PHYSICAL THERAPY DISCHARGE SUMMARY  Visits from Start of Care: 7  Current functional level related to goals / functional outcomes: See above   Remaining deficits: Unknown a she has not returned and it appears she saw MD 07/22/15   Education / Equipment: HEP Plan:                                                     Patient goals were partially met. Patient is being discharged due to not returning since the last visit.  ?????   Darrel Hoover, PT                 08/22/15     4:17PM

## 2015-07-18 ENCOUNTER — Ambulatory Visit: Payer: PRIVATE HEALTH INSURANCE

## 2015-08-08 IMAGING — CT CT PARANASAL SINUSES LIMITED
1 series · 8 of 10 positions shown, 10 images · non-contrast
Comparison: None.

CLINICAL DATA: Sinus pressure on the right. Congestion. Deviated
septum. Recent conclusion of antibiotic therapy.

EXAM:
CT PARANASAL SINUS LIMITED WITHOUT CONTRAST
TECHNIQUE: Non-contiguous multidetector CT images of the paranasal sinuses were
obtained in a single plane without contrast.

[Series 3: coronal soft · axial · 0.33mm/px · z∈[+8,+78]mm · 8 of 10 slices shown, 10 images]
[im 2/10  brain]
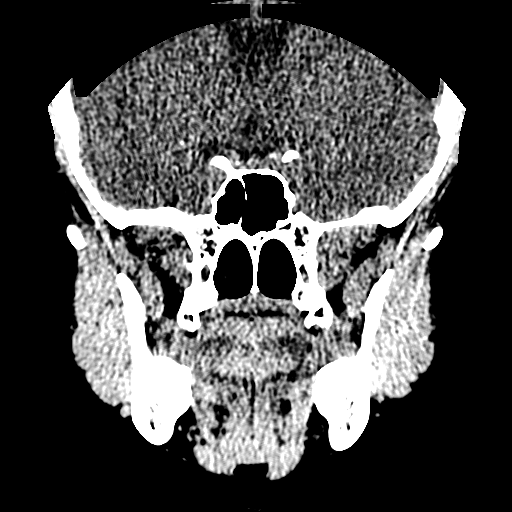
[im 2/10  bone]
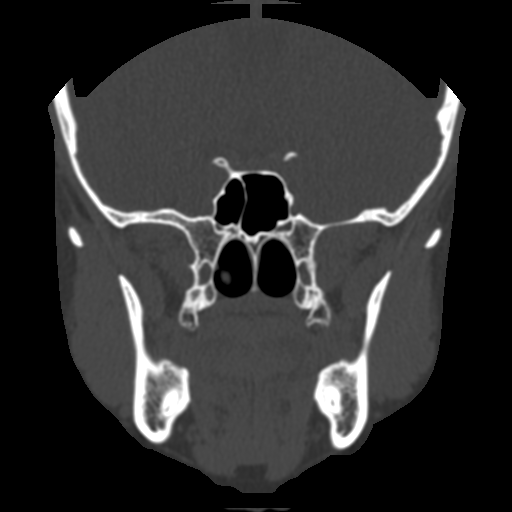
[im 3/10  bone]
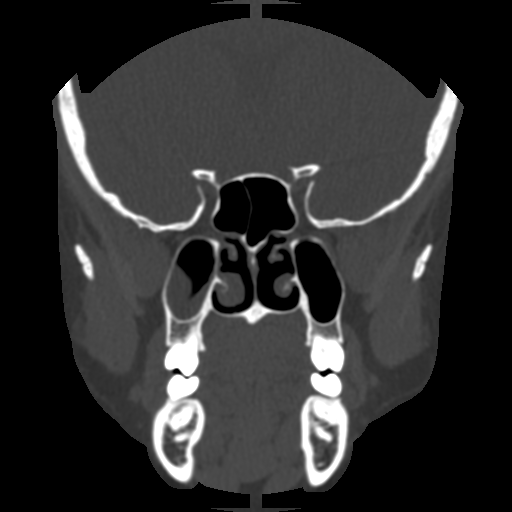
[im 4/10  bone]
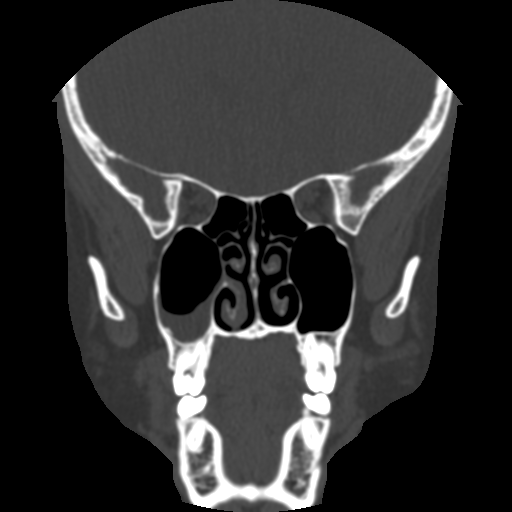
[im 5/10  bone]
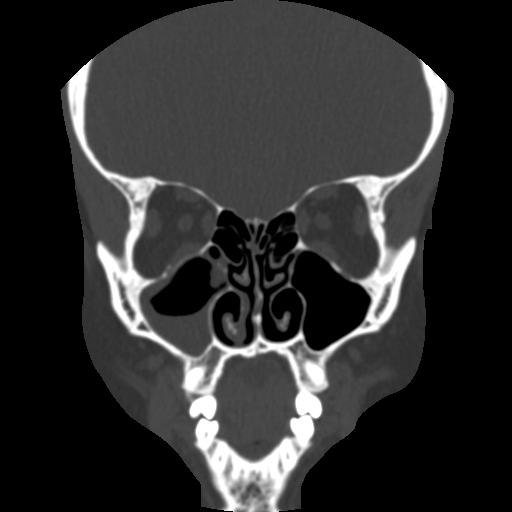
[im 6/10  brain]
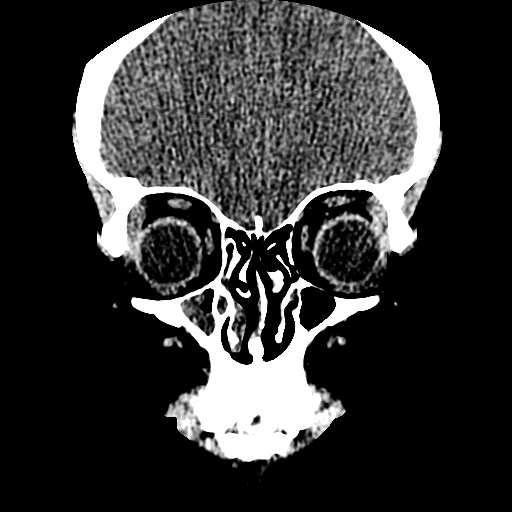
[im 6/10  bone]
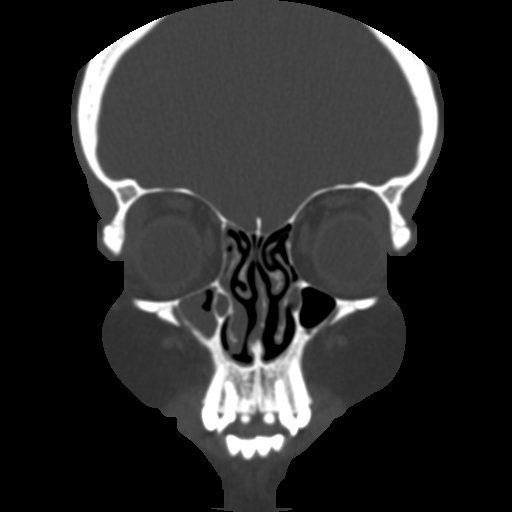
[im 7/10  bone]
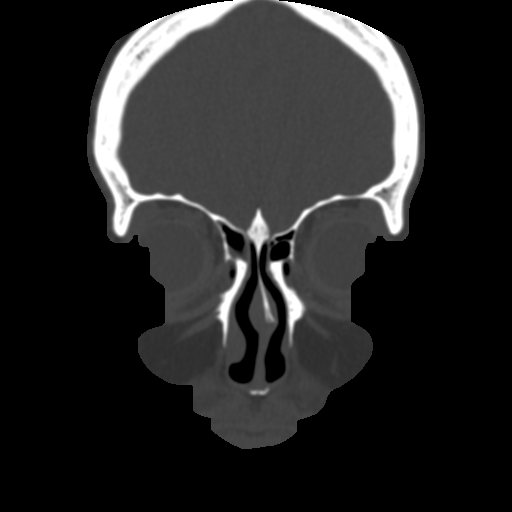
[im 8/10  bone]
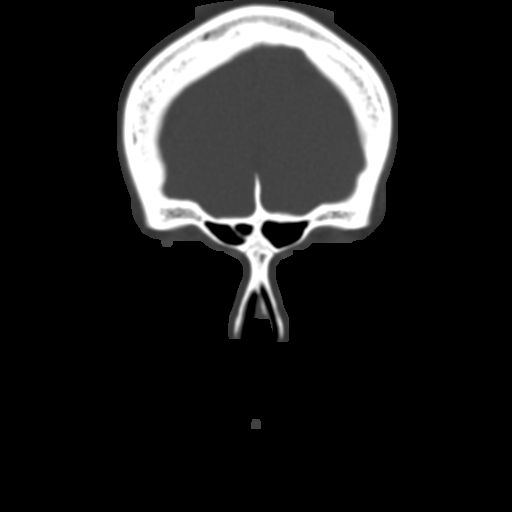
[im 9/10  bone]
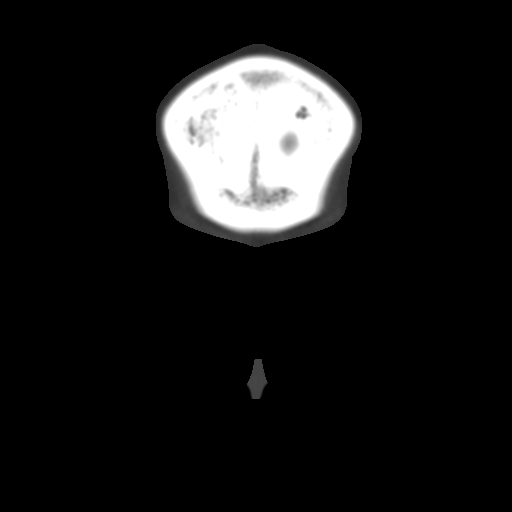

[8 of 10 positions shown; findings below may reference images not displayed]

FINDINGS: Moderate chronic left maxillary sinusitis. Mild nasal mucosal
swelling, left greater than right. Up to 4 mm rightward nasal septal
deviation anteriorly.
IMPRESSION: 1. Chronic left maxillary sinusitis.
2. Mild nasal mucosal swelling. There is up to 4 mm rightward nasal
septal deviation anteriorly.

## 2015-08-21 ENCOUNTER — Other Ambulatory Visit: Payer: Self-pay | Admitting: Occupational Medicine

## 2015-08-21 ENCOUNTER — Ambulatory Visit: Payer: Self-pay

## 2015-08-21 DIAGNOSIS — M549 Dorsalgia, unspecified: Secondary | ICD-10-CM

## 2015-09-02 ENCOUNTER — Other Ambulatory Visit (HOSPITAL_COMMUNITY): Payer: Self-pay | Admitting: Orthopedic Surgery

## 2015-09-02 DIAGNOSIS — M5441 Lumbago with sciatica, right side: Secondary | ICD-10-CM

## 2015-09-03 ENCOUNTER — Ambulatory Visit (HOSPITAL_COMMUNITY)
Admission: RE | Admit: 2015-09-03 | Discharge: 2015-09-03 | Disposition: A | Discharge status: Home / Self Care | Payer: BLUE CROSS/BLUE SHIELD | Source: Ambulatory Visit | Attending: Orthopedic Surgery | Admitting: Orthopedic Surgery

## 2015-09-03 DIAGNOSIS — M5441 Lumbago with sciatica, right side: Secondary | ICD-10-CM

## 2015-09-03 DIAGNOSIS — M545 Low back pain: Secondary | ICD-10-CM | POA: Insufficient documentation

## 2015-09-17 ENCOUNTER — Other Ambulatory Visit: Payer: Self-pay | Admitting: Orthopedic Surgery

## 2015-09-17 DIAGNOSIS — M533 Sacrococcygeal disorders, not elsewhere classified: Secondary | ICD-10-CM

## 2015-09-30 ENCOUNTER — Ambulatory Visit
Admission: RE | Admit: 2015-09-30 | Discharge: 2015-09-30 | Disposition: A | Discharge status: Home / Self Care | Payer: Worker's Compensation | Source: Ambulatory Visit | Attending: Orthopedic Surgery | Admitting: Orthopedic Surgery

## 2015-09-30 DIAGNOSIS — M533 Sacrococcygeal disorders, not elsewhere classified: Secondary | ICD-10-CM

## 2015-10-10 ENCOUNTER — Other Ambulatory Visit: Payer: Self-pay | Admitting: Orthopedic Surgery

## 2015-10-10 DIAGNOSIS — M533 Sacrococcygeal disorders, not elsewhere classified: Secondary | ICD-10-CM

## 2015-10-21 ENCOUNTER — Encounter: Payer: Self-pay | Admitting: Physical Therapy

## 2015-10-21 ENCOUNTER — Ambulatory Visit: Payer: PRIVATE HEALTH INSURANCE | Attending: Orthopedic Surgery | Admitting: Physical Therapy

## 2015-10-21 DIAGNOSIS — R252 Cramp and spasm: Secondary | ICD-10-CM | POA: Insufficient documentation

## 2015-10-21 DIAGNOSIS — M6281 Muscle weakness (generalized): Secondary | ICD-10-CM | POA: Diagnosis not present

## 2015-10-21 DIAGNOSIS — M5441 Lumbago with sciatica, right side: Secondary | ICD-10-CM | POA: Diagnosis present

## 2015-10-21 NOTE — Therapy (Signed)
[facility] [address], [address] [place], [place], [address] Phone: [phone]   Fax:  [phone]  Physical Therapy Evaluation  Patient Details  Name: Karen Rowe MRN: [id number] Date of Birth: [birth year] Referring Provider: Lanell Personshomas Kingsley, MD  Encounter Date: 10/21/2015      PT End of Session - 10/21/15 0919    Visit Number 1   Date for PT Re-Evaluation 12/16/15   PT Start Time 0845   PT Stop Time 0923   PT Time Calculation (min) 38 min   Activity Tolerance Patient tolerated treatment well   Behavior During Therapy [facility] for tasks assessed/performed      Past Medical History  Diagnosis Date  . Migraines     History reviewed. No pertinent past surgical history.  There were no vitals filed for this visit.       Subjective Assessment - 10/21/15 0853    Subjective Pt with back pain since January after a pt fell with her at work, has had Rt LE pain about 5 years but worsened after fall. Had lidocaine injection into SI joint which has provided some relief.   Limitations Sitting   How long can you sit comfortably? 60   Patient Stated Goals be able to sit and lay down without pain, work without pain   Currently in Pain? Yes   Pain Score 4    Pain Location Back   Pain Orientation Lower;Right   Pain Descriptors / Indicators Sore   Pain Type Chronic pain   Pain Radiating Towards Rt side   Pain Onset More than a month ago   Pain Frequency Constant   Aggravating Factors  bend, stoop, lying down, traction, sitting for a long time   Pain Relieving Factors injection, dry needling, mckenzie lateral shifts   Effect of Pain on Daily Activities difficult to work and sleep without pain            [facility] PT Assessment - 10/21/15 0001    Assessment   Medical Diagnosis SI joint pain   Next MD Visit 7/24   Prior Therapy yes, prior to injection   Precautions   Precautions None   Restrictions   Weight Bearing  Restrictions No   Balance Screen   Has the patient fallen in the past 6 months No   Observation/Other Assessments   Focus on Therapeutic Outcomes (FOTO)  53% limited   ROM / Strength   AROM / PROM / Strength AROM;Strength   AROM   Overall AROM Comments hip ROM WFL excepet Rt hip limited in ER/IR   Strength   Overall Strength Comments Rt hip 3-/5 abduction due to pain  Lt Hip WFL, Rt hip flex 3+/5, ext 3+/5   Palpation   Spinal mobility ttp L3-5   SI assessment  ttp Rt > Lt SI jt, Rt side elevated   Palpation comment Pt with stiffness SI and lower lumbar jts with AP mobs   Special Tests   Lumbar Tests FABER test;Straight Leg Raise   Sacroiliac Tests  Pelvic Distraction   FABER test   findings Negative   Side LEft  bilat   Straight Leg Raise   Findings Positive   Side  Right   Pelvic Dictraction   Findings Positive   Side  Right   Pelvic Compression   Findings Negative   Side --  bilat                   [facility] Adult  PT Treatment/Exercise - 10/21/15 0001    Manual Therapy   Manual Therapy Joint mobilization   Joint Mobilization grade 2-3 PAs SI jt and L 3-5 for pain relief                PT Education - 10/21/15 0918    Education provided Yes   Education Details ab set, ab set with hip ER, LTR, lateral shifts   Person(s) Educated Patient   Methods Explanation;Demonstration   Comprehension Verbalized understanding;Returned demonstration          PT Short Term Goals - 10/21/15 0927    PT SHORT TERM GOAL #1   Title pt will be independent in initial HEP   Time 4   Period Weeks   Status New   PT SHORT TERM GOAL #2   Title Pt will increase Rt hip strength to 4/5 to improve ability to work without pain   Time 4   Period Weeks   Status New   PT SHORT TERM GOAL #3   Title Pt will sleep x 5 hours without waking due to pain   Time 4   Period Weeks   Status New           PT Long Term Goals - 10/21/15 1610    PT LONG TERM GOAL #1   Title  FOTO will improve to < 39% to demo improved funcitonal mobility   Time 8   Period Weeks   PT LONG TERM GOAL #2   Title pt will report LE pain decrease by 50% in intensity or frequency   Time 8   Period Weeks   PT LONG TERM GOAL #3   Title Pt will sleep through the night 75% of the time without waking due to pain   PT LONG TERM GOAL #4   Title Pt will be independent in advanced HEP   Time 8   Period Weeks   PT LONG TERM GOAL #5   Title Pt will improve Rt hip strength to 4+/5 to work without pain   Time 8   Period Weeks               Plan - 10/21/15 9604    Clinical Impression Statement Pt presents with chronic SI, back and Rt LE pain. Pt with previous PT treatment and recent lidocaine injection with some results. Pt still with tenderness in SI and low back joints, Rt LE weakness and increased pain with position changes and lifting. Pt will benefit from continued skilled PT for core strengthening and to decrease pain.   Rehab Potential Good   PT Frequency 2x / week   PT Duration 8 weeks   PT Treatment/Interventions Electrical Stimulation;Cryotherapy;Iontophoresis 4mg /ml Dexamethasone;Moist Heat;Traction;Therapeutic activities;Therapeutic exercise;Balance training;Patient/family education;Functional mobility training;Gait training;Neuromuscular re-education;Passive range of motion;Dry needling;Taping;Manual techniques   PT Next Visit Plan assess HEP and jt mobs, continue with jt mobilitzation, core strengthening   PT Home Exercise Plan ab set, ab set with ER, LTR   Consulted and Agree with Plan of Care Patient      Patient will benefit from skilled therapeutic intervention in order to improve the following deficits and impairments:  Decreased activity tolerance, Pain, Decreased strength, Impaired flexibility  Visit Diagnosis: Muscle weakness (generalized)  Midline low back pain with right-sided sciatica     Problem List There are no active problems to display for this  patient.   Reggy Eye, PT, DPT  10/21/2015, 9:34 AM  [facility] [address], [address] [place], [place], [address] Phone: [phone]   Fax:  [phone]  Name: SHYKERIA SAKAMOTO MRN: [id number] Date of Birth: [birth year]

## 2015-10-25 ENCOUNTER — Ambulatory Visit: Payer: PRIVATE HEALTH INSURANCE | Admitting: Physical Therapy

## 2015-10-25 DIAGNOSIS — M6281 Muscle weakness (generalized): Secondary | ICD-10-CM

## 2015-10-25 DIAGNOSIS — R252 Cramp and spasm: Secondary | ICD-10-CM

## 2015-10-25 DIAGNOSIS — M5441 Lumbago with sciatica, right side: Secondary | ICD-10-CM

## 2015-10-25 NOTE — Therapy (Signed)
[facility] [address], [address] [place], [place], [address] Phone: [phone]   Fax:  [phone]  Physical Therapy Treatment  Patient Details  Name: Karen Rowe MRN: [id number] Date of Birth: January 18, 1991 Referring Provider: Lanell Personshomas Kingsley, MD  Encounter Date: 10/25/2015      PT End of Session - 10/25/15 1013    Visit Number 2   Number of Visits 8   Date for PT Re-Evaluation 12/16/15   Authorization Type W/C   Authorization - Visit Number 2   Authorization - Number of Visits 8   PT Start Time 0930   PT Stop Time 1030   PT Time Calculation (min) 60 min   Activity Tolerance Patient tolerated treatment well   Behavior During Therapy [facility] for tasks assessed/performed      Past Medical History  Diagnosis Date  . Migraines     No past surgical history on file.  There were no vitals filed for this visit.      Subjective Assessment - 10/25/15 0936    Subjective I felt sore after the initial evaluation.     Limitations Sitting   How long can you sit comfortably? 60   Patient Stated Goals be able to sit and lay down without pain, work without pain   Currently in Pain? Yes   Pain Score 4    Pain Location Back   Pain Orientation Lower;Right   Pain Descriptors / Indicators Sore   Pain Type Chronic pain   Pain Radiating Towards right side   Pain Onset More than a month ago   Pain Frequency Constant   Aggravating Factors  bend, stoop, lying down, traction, sitting for long time   Pain Relieving Factors injection, dry needling, Meckenzie shifts   Effect of Pain on Daily Activities difficult to work and sleep without pain.    Multiple Pain Sites No            [facility] PT Assessment - 10/25/15 0001    Palpation   SI assessment  right ilium anteriorly rotated;    Palpation comment right psoas, right levator ani, right piriformis, right sacral sulcus                     [facility] Adult PT  Treatment/Exercise - 10/25/15 0001    Modalities   Modalities Electrical Stimulation;Moist Heat   Moist Heat Therapy   Number Minutes Moist Heat 20 Minutes   Moist Heat Location Lumbar Spine  prone   Electrical Stimulation   Electrical Stimulation Location lumbar gluteal   Electrical Stimulation Action IFC   Electrical Stimulation Parameters 20 min; to patient tolerance   Electrical Stimulation Goals Pain   Manual Therapy   Manual Therapy Soft tissue mobilization;Joint mobilization;Muscle Energy Technique;Myofascial release   Joint Mobilization P-A and rotational mobs. grade 3 to L1-L5; Sacral mobilization to correct left sidebending; Anterior glide of femoral head grade 3 in prone   Soft tissue mobilization bil. quadratus; right psoas; right piriformis; right coccygeus, around right greater trochanter and TFL   Myofascial Release along the right Lumbar sacral area; along the right posterior femoral head                PT Education - 10/25/15 1013    Education provided No          PT Short Term Goals - 10/21/15 0927    PT SHORT TERM GOAL #1   Title pt will be independent in initial HEP  Time 4   Period Weeks   Status New   PT SHORT TERM GOAL #2   Title Pt will increase Rt hip strength to 4/5 to improve ability to work without pain   Time 4   Period Weeks   Status New   PT SHORT TERM GOAL #3   Title Pt will sleep x 5 hours without waking due to pain   Time 4   Period Weeks   Status New           PT Long Term Goals - 10/21/15 0981    PT LONG TERM GOAL #1   Title FOTO will improve to < 39% to demo improved funcitonal mobility   Time 8   Period Weeks   PT LONG TERM GOAL #2   Title pt will report LE pain decrease by 50% in intensity or frequency   Time 8   Period Weeks   PT LONG TERM GOAL #3   Title Pt will sleep through the night 75% of the time without waking due to pain   PT LONG TERM GOAL #4   Title Pt will be independent in advanced HEP   Time 8    Period Weeks   PT LONG TERM GOAL #5   Title Pt will improve Rt hip strength to 4+/5 to work without pain   Time 8   Period Weeks               Plan - 10/25/15 1014    Clinical Impression Statement Patient had many trigger points in the quadratus lumborum, right piriformis, right coccygeus, and right pelvic floor muscles. Patient had decreased mobility of of L1-L5 and right hip.  After therapy increased mobility of right femoral head anteriorly.  Decreased pain to palpation. Patient will beneift from physical therapy to reduce pain and improve mobility.    Rehab Potential Good   Clinical Impairments Affecting Rehab Potential none   PT Frequency 2x / week   PT Duration 8 weeks   PT Treatment/Interventions Electrical Stimulation;Cryotherapy;Iontophoresis /ml Dexamethasone;Moist Heat;Traction;Therapeutic activities;Therapeutic exercise;Balance training;Patient/family education;Functional mobility training;Gait training;Neuromuscular re-education;Passive range of motion;Dry needling;Taping;Manual techniques   PT Next Visit Plan check pelvic alignment; joint mobilization, soft tissue work, abdominal bracing   PT Home Exercise Plan progress as needed   Recommended Other Services None   Consulted and Agree with Plan of Care Patient      Patient will benefit from skilled therapeutic intervention in order to improve the following deficits and impairments:  Decreased activity tolerance, Pain, Decreased strength, Impaired flexibility  Visit Diagnosis: Muscle weakness (generalized)  Midline low back pain with right-sided sciatica  Cramp and spasm     Problem List There are no active problems to display for this patient.   Eulis Foster, PT 10/25/2015 10:22 AM    [facility] [address], [address] [place], [place], [address] Phone: [phone]   Fax:  [phone]  Name: Karen Rowe MRN: [id number] Date of Birth:  04/29/1990

## 2015-10-30 ENCOUNTER — Ambulatory Visit: Payer: PRIVATE HEALTH INSURANCE | Admitting: Physical Therapy

## 2015-10-30 ENCOUNTER — Encounter: Payer: Self-pay | Admitting: Physical Therapy

## 2015-10-30 DIAGNOSIS — M6281 Muscle weakness (generalized): Secondary | ICD-10-CM

## 2015-10-30 DIAGNOSIS — M5441 Lumbago with sciatica, right side: Secondary | ICD-10-CM

## 2015-10-30 DIAGNOSIS — J342 Deviated nasal septum: Secondary | ICD-10-CM | POA: Diagnosis not present

## 2015-10-30 DIAGNOSIS — R252 Cramp and spasm: Secondary | ICD-10-CM

## 2015-10-30 DIAGNOSIS — K08409 Partial loss of teeth, unspecified cause, unspecified class: Secondary | ICD-10-CM | POA: Diagnosis not present

## 2015-10-30 NOTE — Therapy (Signed)
[facility] [address], [address] [place], [place], [address] Phone: [phone]   Fax:  [phone]  Physical Therapy Treatment  Patient Details  Name: Karen LinerJulia A Rowe MRN: [id number] Date of Birth: 08-May-1990 Referring Provider: Lanell Personshomas Kingsley, MD  Encounter Date: 10/30/2015      PT End of Session - 10/30/15 1155    Visit Number 3   Number of Visits 8   Date for PT Re-Evaluation 12/16/15   Authorization Type W/C   Authorization - Visit Number 3   Authorization - Number of Visits 8   PT Start Time 1150   PT Stop Time 1250   PT Time Calculation (min) 60 min   Activity Tolerance Patient tolerated treatment well   Behavior During Therapy [facility] for tasks assessed/performed      Past Medical History  Diagnosis Date  . Migraines     History reviewed. No pertinent past surgical history.  There were no vitals filed for this visit.      Subjective Assessment - 10/30/15 1153    Subjective Felt good after last session for 1-2 days. Lifted some heavier pts at work with probably some more forward bending and hurt pretty good last night.    Currently in Pain? Yes   Pain Score 5    Pain Location Buttocks   Pain Orientation Right   Pain Descriptors / Indicators Sore;Tender   Aggravating Factors  Bending, sitting too long   Pain Relieving Factors Not working    Multiple Pain Sites No                         [facility] Adult PT Treatment/Exercise - 10/30/15 0001    Self-Care   Self-Care Posture  Educated pt on proper weightbearing and lessening lordosis   Lumbar Exercises: Stretches   Hip Flexor Stretch 1 rep;30 seconds   Pelvic Tilt --  10 x 5 sec hold with focus on symmertry bil   Prone Mid Back Stretch --  Childs pose 3x 20 sec   Piriformis Stretch 3 reps;20 seconds   Lumbar Exercises: Supine   Ab Set --  TA contraction 10x   Bent Knee Raise --  Marching 10x alternating   Other Supine Lumbar  Exercises Piriformis release with orange hard ball    Other Supine Lumbar Exercises Supine on foam roll; TA contraction 10x with eyes closed  LE/lumbar release on foam roll   Moist Heat Therapy   Number Minutes Moist Heat 15 Minutes   Moist Heat Location Lumbar Spine   Electrical Stimulation   Electrical Stimulation Location lumbar gluteal  Gluteals   Electrical Stimulation Action IFC   Electrical Stimulation Parameters 15   Electrical Stimulation Goals Pain   Manual Therapy   Manual Therapy --  RT ilium anterior to LT:                   PT Short Term Goals - 10/30/15 1235    PT SHORT TERM GOAL #1   Title pt will be independent in initial HEP   Time 4   Period Weeks   Status Achieved   PT SHORT TERM GOAL #2   Title Pt will increase Rt hip strength to 4/5 to improve ability to work without pain   Time 4   Period Weeks   Status On-going  Too soon for retest   PT SHORT TERM GOAL #3   Title Pt will sleep x 5 hours  without waking due to pain   Time 4   Period Weeks   Status On-going           PT Long Term Goals - 10/21/15 1610    PT LONG TERM GOAL #1   Title FOTO will improve to < 39% to demo improved funcitonal mobility   Time 8   Period Weeks   PT LONG TERM GOAL #2   Title pt will report LE pain decrease by 50% in intensity or frequency   Time 8   Period Weeks   PT LONG TERM GOAL #3   Title Pt will sleep through the night 75% of the time without waking due to pain   PT LONG TERM GOAL #4   Title Pt will be independent in advanced HEP   Time 8   Period Weeks   PT LONG TERM GOAL #5   Title Pt will improve Rt hip strength to 4+/5 to work without pain   Time 8   Period Weeks               Plan - 10/30/15 1220    Clinical Impression Statement RT ilium was anterior to LT initially, with exercises including piriformis release, stretching, core work, the ASIS levels leveled out. No pain at the end of the session.    Rehab Potential Good    Clinical Impairments Affecting Rehab Potential none   PT Frequency 2x / week   PT Duration 8 weeks   PT Treatment/Interventions Electrical Stimulation;Cryotherapy;Iontophoresis /ml Dexamethasone;Moist Heat;Traction;Therapeutic activities;Therapeutic exercise;Balance training;Patient/family education;Functional mobility training;Gait training;Neuromuscular re-education;Passive range of motion;Dry needling;Taping;Manual techniques   PT Next Visit Plan See if pt got tennis ball to work on piriformis at home, continue core strength amd opening up the RT hip.    Consulted and Agree with Plan of Care Patient      Patient will benefit from skilled therapeutic intervention in order to improve the following deficits and impairments:  Decreased activity tolerance, Pain, Decreased strength, Impaired flexibility  Visit Diagnosis: Midline low back pain with right-sided sciatica  Muscle weakness (generalized)  Cramp and spasm     Problem List There are no active problems to display for this patient.   Ambar Raphael, PTA 10/30/2015, 12:36 PM  [facility] [address], [address] [place], [place], [address] Phone: [phone]   Fax:  [phone]  Name: Karen Rowe MRN: [id number] Date of Birth: 1990-06-01

## 2015-11-01 ENCOUNTER — Ambulatory Visit
Admission: RE | Admit: 2015-11-01 | Discharge: 2015-11-01 | Disposition: A | Discharge status: Home / Self Care | Payer: Worker's Compensation | Source: Ambulatory Visit | Attending: Orthopedic Surgery | Admitting: Orthopedic Surgery

## 2015-11-01 DIAGNOSIS — M533 Sacrococcygeal disorders, not elsewhere classified: Secondary | ICD-10-CM

## 2015-11-01 MED ORDER — METHYLPREDNISOLONE ACETATE 40 MG/ML INJ SUSP (RADIOLOG: 120.0000 mg | Freq: Once | INTRAMUSCULAR | Status: DC

## 2015-11-04 ENCOUNTER — Encounter: Payer: Self-pay | Admitting: Physical Therapy

## 2015-11-04 ENCOUNTER — Ambulatory Visit: Payer: PRIVATE HEALTH INSURANCE | Admitting: Physical Therapy

## 2015-11-04 DIAGNOSIS — M6281 Muscle weakness (generalized): Secondary | ICD-10-CM | POA: Diagnosis not present

## 2015-11-04 DIAGNOSIS — R252 Cramp and spasm: Secondary | ICD-10-CM

## 2015-11-04 DIAGNOSIS — M5441 Lumbago with sciatica, right side: Secondary | ICD-10-CM

## 2015-11-04 NOTE — Therapy (Signed)
[facility] [address] [place], [place], [address] Phone: [phone]   Fax:  [phone]  Physical Therapy Treatment  Patient Details  Name: Karen Rowe MRN: [id number] Date of Birth: [birth year] Referring Provider: Odis Luster, MD  Encounter Date: 11/04/2015      PT End of Session - 11/04/15 1143    Visit Number 4   Number of Visits 8   Date for PT Re-Evaluation 12/16/15   Authorization Type W/C   Authorization - Visit Number 4   Authorization - Number of Visits 8   PT Start Time 1100   PT Stop Time 3202   PT Time Calculation (min) 65 min   Activity Tolerance Patient tolerated treatment well   Behavior During Therapy [facility] for tasks assessed/performed      Past Medical History:  Diagnosis Date  . Migraines     History reviewed. No pertinent surgical history.  There were no vitals filed for this visit.      Subjective Assessment - 11/04/15 1111    Subjective I had an injection last Friday.  I have had an ache since then in my buttocks. I have no leg pain.    Limitations Sitting   How long can you sit comfortably? 60   Patient Stated Goals be able to sit and lay down without pain, work without pain   Currently in Pain? Yes   Pain Score 3    Pain Location Buttocks   Pain Orientation Right   Pain Descriptors / Indicators Aching   Pain Type Chronic pain   Pain Onset More than a month ago   Pain Frequency Constant   Aggravating Factors  bending, sitting too long   Pain Relieving Factors not working   Effect of Pain on Daily Activities difficult to work and sleep without pain   Multiple Pain Sites No            [facility] PT Assessment - 11/04/15 0001      Strength   Overall Strength Comments right hip abd is 4+/5, extension and flexion 5/5     Palpation   Spinal mobility L1-L5 full movement   SI assessment  right ilium anteriorly rotated; sacrum rotated left                      [facility] Adult PT Treatment/Exercise - 11/04/15 0001      Lumbar Exercises: Supine   Bridge 20 reps  slow and control     Lumbar Exercises: Quadruped   Opposite Arm/Leg Raise Right arm/Left leg;Left arm/Right leg;10 reps;Other (comment)  tactlie cues for stability   Opposite Arm/Leg Raise Limitations right arm and left g easier     Modalities   Modalities Electrical Stimulation;Moist Heat     Moist Heat Therapy   Number Minutes Moist Heat 20 Minutes  prone   Moist Heat Location Lumbar Spine     Electrical Stimulation   Electrical Stimulation Location lumbar gluteal  Gluteals   Electrical Stimulation Action IFC   Electrical Stimulation Parameters 20, to patient tolerance prone   Electrical Stimulation Goals Pain     Manual Therapy   Manual Therapy Soft tissue mobilization;Muscle Energy Technique   Joint Mobilization sacral mobilzation to correct rotation   Soft tissue mobilization to righ tpiriformis, quadratus lumborum, psoas, gluteal, and along the right SI joint in prone   Muscle Energy Technique to correct anterior rotated right ilium  PT Education - 11/04/15 1143    Education provided No          PT Short Term Goals - 11/04/15 1114      PT SHORT TERM GOAL #1   Title pt will be independent in initial HEP   Time 4   Period Weeks   Status Achieved     PT SHORT TERM GOAL #2   Title Pt will increase Rt hip strength to 4/5 to improve ability to work without pain   Time 4   Period Weeks   Status On-going  hip strength has increased but has pain with work     PT Tappen #3   Title Pt will sleep x 5 hours without waking due to pain   Time 4   Period Weeks   Status Achieved           PT Long Term Goals - 10/21/15 3244      PT LONG TERM GOAL #1   Title FOTO will improve to < 39% to demo improved funcitonal mobility   Time 8   Period Weeks     PT LONG TERM GOAL #2   Title pt will report LE  pain decrease by 50% in intensity or frequency   Time 8   Period Weeks     PT LONG TERM GOAL #3   Title Pt will sleep through the night 75% of the time without waking due to pain     PT LONG TERM GOAL #4   Title Pt will be independent in advanced HEP   Time 8   Period Weeks     PT LONG TERM GOAL #5   Title Pt will improve Rt hip strength to 4+/5 to work without pain   Time 8   Period Weeks               Plan - 11/04/15 1143    Clinical Impression Statement Patient pelvis in correct alignment after therapy, Reduction in trigger points in right quadratus and psoas after treatment.  Good mobility of L1-L5.  Patient had injection in right SI and had residual  achiness in right buttocks.  No right leg pain.  Patient has increased strength in right hip.  Patient has met her STG's. Patient will benefit form physical therapy to increase core strength and reduce pain.    Rehab Potential Good   Clinical Impairments Affecting Rehab Potential none   PT Frequency 2x / week   PT Duration 8 weeks   PT Treatment/Interventions Electrical Stimulation;Cryotherapy;Iontophoresis 49m/ml Dexamethasone;Moist Heat;Traction;Therapeutic activities;Therapeutic exercise;Balance training;Patient/family education;Functional mobility training;Gait training;Neuromuscular re-education;Passive range of motion;Dry needling;Taping;Manual techniques   PT Next Visit Plan gym exercise program with correct body mechanics, soft tissue work, assess pelvic alignment, stim and heat   PT Home Exercise Plan gym program   Recommended Other Services None   Consulted and Agree with Plan of Care Patient      Patient will benefit from skilled therapeutic intervention in order to improve the following deficits and impairments:  Decreased activity tolerance, Pain, Decreased strength, Impaired flexibility  Visit Diagnosis: Midline low back pain with right-sided sciatica  Muscle weakness (generalized)  Cramp and  spasm     Problem List There are no active problems to display for this patient.   CEarlie Counts PT 11/04/15 11:48 AM   [facility] [address] [place] [address]Phone: [phone]  Fax:  [phone] Name: JMarliss  ZEPHYRA BERNARDI MRN: [id number] Date of Birth: 1991-03-13

## 2015-11-08 ENCOUNTER — Ambulatory Visit: Payer: PRIVATE HEALTH INSURANCE | Admitting: Physical Therapy

## 2015-11-08 ENCOUNTER — Encounter: Payer: Self-pay | Admitting: Physical Therapy

## 2015-11-08 DIAGNOSIS — M6281 Muscle weakness (generalized): Secondary | ICD-10-CM

## 2015-11-08 DIAGNOSIS — M5441 Lumbago with sciatica, right side: Secondary | ICD-10-CM

## 2015-11-08 DIAGNOSIS — R252 Cramp and spasm: Secondary | ICD-10-CM

## 2015-11-08 NOTE — Therapy (Signed)
[facility] [address] [place], [place], [address] Phone: [phone]   Fax:  [phone]  Physical Therapy Treatment  Patient Details  Name: Karen Rowe MRN: [id number] Date of Birth: January 28, 1991 Referring Provider: Dr. Timmothy Euler  Encounter Date: 11/08/2015      PT End of Session - 11/08/15 0851    Visit Number 5   Number of Visits 8   Date for PT Re-Evaluation 12/16/15   Authorization Type W/C   Authorization - Visit Number 5   Authorization - Number of Visits 8   PT Start Time 0845   PT Stop Time 0945   PT Time Calculation (min) 60 min   Activity Tolerance Patient tolerated treatment well   Behavior During Therapy [facility] for tasks assessed/performed      Past Medical History:  Diagnosis Date  . Migraines     History reviewed. No pertinent surgical history.  There were no vitals filed for this visit.      Subjective Assessment - 11/08/15 0849    Subjective no radicular symptoms in right leg.  I only have achy feeling in the right SI area where the shot was.    Limitations Sitting   How long can you sit comfortably? 60   Patient Stated Goals be able to sit and lay down without pain, work without pain   Currently in Pain? Yes   Pain Score 1    Pain Location Buttocks   Pain Orientation Right   Pain Descriptors / Indicators Aching   Pain Type Chronic pain   Pain Radiating Towards right side   Pain Onset More than a month ago   Pain Frequency Constant   Aggravating Factors  bending, sitting too long   Pain Relieving Factors not working   Multiple Pain Sites No            [facility] PT Assessment - 11/08/15 0001      Assessment   Medical Diagnosis SI joint pain   Referring Provider Dr. Timmothy Euler   Onset Date/Surgical Date 05/04/15   Prior Therapy yes, prior to injection     Precautions   Precautions None     Restrictions   Weight Bearing Restrictions No     Balance Screen   Has the patient fallen in the past 6 months No   Has the patient had a decrease in activity level because of a fear of falling?  No   Is the patient reluctant to leave their home because of a fear of falling?  No     Home Tourist information centre manager residence     Prior Function   Level of Independence Independent     Cognition   Overall Cognitive Status Within Functional Limits for tasks assessed     Observation/Other Assessments   Focus on Therapeutic Outcomes (FOTO)  53% limited     Posture/Postural Control   Posture/Postural Control No significant limitations     Strength   Overall Strength Comments right hip abduction 5/5     Palpation   SI assessment  pelvis in correct alignment                     [facility] Adult PT Treatment/Exercise - 11/08/15 0001      Lumbar Exercises: Aerobic   Elliptical level 1 6 min going back and forth every min.      Lumbar Exercises: Supine   Bridge 20 reps  slow  and control     Modalities   Modalities Electrical Stimulation;Moist Heat;Ultrasound     Moist Heat Therapy   Number Minutes Moist Heat 20 Minutes   Moist Heat Location Lumbar Spine     Electrical Stimulation   Electrical Stimulation Location lumbar gluteal  Gluteals   Electrical Stimulation Action IFC   Electrical Stimulation Parameters 20 min, to patient tolerance   Electrical Stimulation Goals Pain     Ultrasound   Ultrasound Location right piriformis in prone   Ultrasound Parameters 100%, , 1.2 w/cm2, 100%   Ultrasound Goals Pain     Manual Therapy   Manual Therapy Soft tissue mobilization;Muscle Energy Technique   Soft tissue mobilization right piriformis,along right SI joint                PT Education - 11/08/15 0911    Education provided Yes   Education Details gym exercises   Person(s) Educated Patient   Methods Explanation;Demonstration;Verbal cues;Handout   Comprehension Returned demonstration;Verbalized  understanding          PT Short Term Goals - 11/08/15 1056      PT SHORT TERM GOAL #1   Title pt will be independent in initial HEP   Time 4   Period Weeks   Status Achieved     PT SHORT TERM GOAL #2   Title Pt will increase Rt hip strength to 4/5 to improve ability to work without pain   Time 4   Period Weeks   Status Achieved     PT SHORT TERM GOAL #3   Title Pt will sleep x 5 hours without waking due to pain   Time 4   Period Weeks   Status Achieved           PT Long Term Goals - 11/08/15 1056      PT LONG TERM GOAL #1   Title FOTO will improve to < 39% to demo improved funcitonal mobility   Time 8   Period Weeks   Status On-going     PT LONG TERM GOAL #2   Title pt will report LE pain decrease by 50% in intensity or frequency   Baseline 20%   Time 8   Period Weeks   Status Achieved     PT LONG TERM GOAL #3   Title Pt will sleep through the night 75% of the time without waking due to pain   Time 4   Period Weeks   Status Achieved     PT LONG TERM GOAL #4   Title Pt will be independent in advanced HEP   Baseline 10 minutes, some days less   Time 8   Period Weeks   Status On-going     PT LONG TERM GOAL #5   Title Pt will improve Rt hip strength to 4+/5 to work without pain   Time 8   Period Weeks   Status Achieved     Additional Long Term Goals   Additional Long Term Goals Yes     PT LONG TERM GOAL #6   Title sit for 15 min. to document with minimal to no pain   Time 8   Period Weeks   Status New     PT LONG TERM GOAL #7   Title ability to lift without difficulty at work due to increased core strength   Time 8   Period Weeks   Status New     PT LONG TERM GOAL #8   Title  ability to place full weight on right lower extremity during work tasks due to pelvis in correct alignment and reduction in muscle spasms   Time 8   Period Weeks   Status New     PT LONG TERM GOAL  #9   TITLE return to dance class due to back pain and right  buttock pain decreased >/= 75%   Time 8   Period Weeks   Status New               Plan - 11/08/15 1050    Clinical Impression Statement Patient pelvis in correct alighment since last visit.  Patient has muscle spasms in right piriformis and gluteus medius. Patient is able to sit for 8 min but not 15 minutes to document.  Patient has increased pain with excessive bending at work.  Patient has trouble lifting due to weakness in lumbar and core strength.  Patient has difficulty placing full weight on right lower extremity due to pain.  Patient has not been able to return to dance class due to pain.  Patient has not had pain in right leg for 2 weeks.  right hip abduction strength has increased to 5/5.  Lumbar mobility has improved. Patient will benefit from physical therapy to reduce pain and improve strength so she is able to perfrom her work duties with greater ease.    Rehab Potential Good   Clinical Impairments Affecting Rehab Potential none   PT Frequency 2x / week   PT Duration 8 weeks   PT Treatment/Interventions Electrical Stimulation;Cryotherapy;Iontophoresis 4mg /ml Dexamethasone;Moist Heat;Traction;Therapeutic activities;Therapeutic exercise;Balance training;Patient/family education;Functional mobility training;Gait training;Neuromuscular re-education;Passive range of motion;Dry needling;Taping;Manual techniques   PT Next Visit Plan physioball exercises for core stability, soft tissue work and modalities, see what MD says   PT Home Exercise Plan physioball exercises   Consulted and Agree with Plan of Care Patient      Patient will benefit from skilled therapeutic intervention in order to improve the following deficits and impairments:  Decreased activity tolerance, Pain, Decreased strength, Impaired flexibility, Increased muscle spasms  Visit Diagnosis: Midline low back pain with right-sided sciatica - Plan: PT plan of care cert/re-cert  Muscle weakness (generalized) - Plan: PT  plan of care cert/re-cert  Cramp and spasm - Plan: PT plan of care cert/re-cert     Problem List There are no active problems to display for this patient.   Eulis Foster, PT 11/08/15 11:04 AM    [facility] [address] [place], [place], [address] Phone: [phone]   Fax:  [phone]  Name: Karen Rowe MRN: [id number] Date of Birth: [birth year]

## 2015-11-08 NOTE — Patient Instructions (Addendum)
Elliptical level 1 6 min. Back and forth every min.   Chest press, 20# 10 x 3  Row machine 20# 10 x 3  Vertical overhead shoulder press 15# 10 x 3  15 squats keeping your form and tight abdomen  [facility] [address], [address] [place], [place] [address] Phone # [phone] Fax [phone]

## 2015-11-13 ENCOUNTER — Ambulatory Visit: Payer: PRIVATE HEALTH INSURANCE | Attending: Orthopedic Surgery | Admitting: Physical Therapy

## 2015-11-13 ENCOUNTER — Encounter: Payer: Self-pay | Admitting: Physical Therapy

## 2015-11-13 DIAGNOSIS — M5442 Lumbago with sciatica, left side: Secondary | ICD-10-CM | POA: Insufficient documentation

## 2015-11-13 DIAGNOSIS — M6281 Muscle weakness (generalized): Secondary | ICD-10-CM | POA: Insufficient documentation

## 2015-11-13 DIAGNOSIS — R252 Cramp and spasm: Secondary | ICD-10-CM | POA: Diagnosis present

## 2015-11-13 DIAGNOSIS — N390 Urinary tract infection, site not specified: Secondary | ICD-10-CM | POA: Diagnosis not present

## 2015-11-13 DIAGNOSIS — M5441 Lumbago with sciatica, right side: Secondary | ICD-10-CM | POA: Diagnosis not present

## 2015-11-13 NOTE — Therapy (Signed)
[facility] [address], [address] [place], [place], [address] Phone: [phone]   Fax:  [phone]  Physical Therapy Treatment  Patient Details  Name: Karen Rowe MRN: [id number] Date of Birth: February 13, 1991 Referring Provider: Dr. Timmothy Euler  Encounter Date: 11/13/2015      PT End of Session - 11/13/15 0809    Visit Number 6   Date for PT Re-Evaluation 12/16/15   Authorization Type W/C   Authorization - Visit Number 6   Authorization - Number of Visits 8   PT Start Time 0807   PT Stop Time 0910   PT Time Calculation (min) 63 min   Activity Tolerance Patient tolerated treatment well   Behavior During Therapy [facility] for tasks assessed/performed      Past Medical History:  Diagnosis Date  . Migraines     History reviewed. No pertinent surgical history.  There were no vitals filed for this visit.      Subjective Assessment - 11/13/15 0809    Subjective  MD took pt off lifting restrictions wjen seen yesterday.   Currently in Pain? No/denies   Multiple Pain Sites No                         [facility] Adult PT Treatment/Exercise - 11/13/15 0001      Lumbar Exercises: Aerobic   Elliptical L2 R2 x 8 min     Lumbar Exercises: Seated   Hip Flexion on Ball --  Sitting on ball: TA with breathe 5x, pelvic rocking, red ban     Lumbar Exercises: Supine   Bridge 20 reps  VC for articulation, finding RT gluteal>LT, femoral alignmen   Other Supine Lumbar Exercises Piriformis release with orange hard ball   Hip Er 10x then tiny steps 10x on ball     Lumbar Exercises: Quadruped   Opposite Arm/Leg Raise Right arm/Left leg;Left arm/Right leg;5 reps;5 seconds     Moist Heat Therapy   Number Minutes Moist Heat 20 Minutes   Moist Heat Location --  lumbar /gluteals     Programme researcher, broadcasting/film/video Location lumbar gluteal  Gluteals   Electrical Stimulation Action IFC   Electrical  Stimulation Parameters 15 min   Electrical Stimulation Goals Pain                PT Education - 11/13/15 0813    Education provided Yes   Education Details Lumbar protective body mechanics, seated physioball exs for HEP   Person(s) Educated Patient   Methods Explanation;Demonstration;Tactile cues;Verbal cues   Comprehension Verbalized understanding;Returned demonstration          PT Short Term Goals - 11/08/15 1056      PT SHORT TERM GOAL #1   Title pt will be independent in initial HEP   Time 4   Period Weeks   Status Achieved     PT SHORT TERM GOAL #2   Title Pt will increase Rt hip strength to 4/5 to improve ability to work without pain   Time 4   Period Weeks   Status Achieved     PT SHORT TERM GOAL #3   Title Pt will sleep x 5 hours without waking due to pain   Time 4   Period Weeks   Status Achieved           PT Long Term Goals - 11/08/15 1056      PT LONG TERM GOAL #1  Title FOTO will improve to < 39% to demo improved funcitonal mobility   Time 8   Period Weeks   Status On-going     PT LONG TERM GOAL #2   Title pt will report LE pain decrease by 50% in intensity or frequency   Baseline 20%   Time 8   Period Weeks   Status Achieved     PT LONG TERM GOAL #3   Title Pt will sleep through the night 75% of the time without waking due to pain   Time 4   Period Weeks   Status Achieved     PT LONG TERM GOAL #4   Title Pt will be independent in advanced HEP   Baseline 10 minutes, some days less   Time 8   Period Weeks   Status On-going     PT LONG TERM GOAL #5   Title Pt will improve Rt hip strength to 4+/5 to work without pain   Time 8   Period Weeks   Status Achieved     Additional Long Term Goals   Additional Long Term Goals Yes     PT LONG TERM GOAL #6   Title sit for 15 min. to document with minimal to no pain   Time 8   Period Weeks   Status New     PT LONG TERM GOAL #7   Title ability to lift without difficulty at work  due to increased core strength   Time 8   Period Weeks   Status New     PT LONG TERM GOAL #8   Title ability to place full weight on right lower extremity during work tasks due to pelvis in correct alignment and reduction in muscle spasms   Time 8   Period Weeks   Status New     PT LONG TERM GOAL  #9   TITLE return to dance class due to back pain and right buttock pain decreased >/= 75%   Time 8   Period Weeks   Status New               Plan - 11/13/15 0810    Clinical Impression Statement Pt saw MD/PA yesterday. Lifting restrictions lifted for work which she is nervous about. We educated pt in lumbar protective body mechanics today. Pt has trouble facilitating RT gluteal muscle. Added to HEP today postural strengthening for HEP on her physioball.     Rehab Potential Good   Clinical Impairments Affecting Rehab Potential none   PT Frequency 2x / week   PT Duration 8 weeks   PT Treatment/Interventions Electrical Stimulation;Cryotherapy;Iontophoresis /ml Dexamethasone;Moist Heat;Traction;Therapeutic activities;Therapeutic exercise;Balance training;Patient/family education;Functional mobility training;Gait training;Neuromuscular re-education;Passive range of motion;Dry needling;Taping;Manual techniques   PT Next Visit Plan Core strength and Rt gluteal strength   Consulted and Agree with Plan of Care Patient      Patient will benefit from skilled therapeutic intervention in order to improve the following deficits and impairments:  Decreased activity tolerance, Pain, Decreased strength, Impaired flexibility, Increased muscle spasms  Visit Diagnosis: Midline low back pain with right-sided sciatica  Muscle weakness (generalized)  Cramp and spasm     Problem List There are no active problems to display for this patient.   Karen Rowe, PTA 11/13/2015, 8:46 AM  [facility] [address] [place], [place], [address] Phone: [phone]   Fax:  [phone]  Name: Karen Rowe MRN: [id number] Date of Birth: 1990/10/10

## 2015-11-13 NOTE — Patient Instructions (Signed)
  PNF Strengthening: Resisted   Standing with resistive band around each hand, bring right arm up and away, thumb back. Repeat _10___ times per set. Do _2___ sets per session. Do _1-2___ sessions per day.      Resisted Horizontal Abduction: Bilateral   Sit or stand, tubing in both hands, arms out in front. Keeping arms straight, pinch shoulder blades together and stretch arms out. Repeat _10___ times per set. Do 2____ sets per session. Do _1-2___ sessions per day.                  Scapular Retraction: Elbow Flexion (Standing)   With elbows bent to 90, pinch shoulder blades together and rotate arms out, keeping elbows bent. Repeat _10___ times per set. Do _1___ sets per session. Do many____ sessions per day.    .   

## 2015-11-18 ENCOUNTER — Ambulatory Visit: Payer: PRIVATE HEALTH INSURANCE | Admitting: Physical Therapy

## 2015-11-18 ENCOUNTER — Encounter: Payer: Self-pay | Admitting: Physical Therapy

## 2015-11-18 DIAGNOSIS — R252 Cramp and spasm: Secondary | ICD-10-CM

## 2015-11-18 DIAGNOSIS — M6281 Muscle weakness (generalized): Secondary | ICD-10-CM

## 2015-11-18 DIAGNOSIS — M5441 Lumbago with sciatica, right side: Secondary | ICD-10-CM | POA: Diagnosis not present

## 2015-11-18 NOTE — Therapy (Signed)
[facility] [address] [place], [place], [address] Phone: [phone]   Fax:  [phone]  Physical Therapy Treatment  Patient Details  Name: Karen Rowe MRN: [id number] Date of Birth: 10/15/1990 Referring Provider: Dr. Timmothy Euler  Encounter Date: 11/18/2015      PT End of Session - 11/18/15 0938    Visit Number 7   Number of Visits 12   Date for PT Re-Evaluation 12/16/15   Authorization Type W/C   Authorization - Visit Number 7   Authorization - Number of Visits 12   PT Start Time 0933   PT Stop Time 1025   PT Time Calculation (min) 52 min   Activity Tolerance Patient tolerated treatment well   Behavior During Therapy [facility] for tasks assessed/performed      Past Medical History:  Diagnosis Date  . Migraines     History reviewed. No pertinent surgical history.  There were no vitals filed for this visit.      Subjective Assessment - 11/18/15 0939    Subjective I have no more restrictions.  I have felt achy the past feel days. sit for 10  min. then has to stand up.     Limitations Sitting   How long can you sit comfortably? 60   Patient Stated Goals be able to sit and lay down without pain, work without pain   Currently in Pain? No/denies   Pain Score 2    Pain Location Buttocks   Pain Orientation Right   Pain Descriptors / Indicators Aching   Pain Type Chronic pain   Pain Onset More than a month ago   Pain Frequency Constant   Aggravating Factors  sitting more than 10 min   Pain Relieving Factors rest   Multiple Pain Sites No                         [facility] Adult PT Treatment/Exercise - 11/18/15 0001      Lumbar Exercises: Stretches   Single Knee to Chest Stretch 3 reps;30 seconds  bil.      Lumbar Exercises: Supine   Bridge 20 reps  VC for articulation, finding RT gluteal>LT, femoral alignmen   Other Supine Lumbar Exercises muscle energy for self correction of pelvis  on right     Modalities   Modalities Electrical Stimulation;Moist Heat     Moist Heat Therapy   Number Minutes Moist Heat 15 Minutes   Moist Heat Location Lumbar Spine;Hip  in left sidely     Electrical Stimulation   Electrical Stimulation Location lumbar gluteal  in left sidely   Electrical Stimulation Action IFC   Electrical Stimulation Parameters 15 min to pateint tolerance   Electrical Stimulation Goals Pain     Manual Therapy   Manual Therapy Soft tissue mobilization;Joint mobilization;Muscle Energy Technique   Joint Mobilization right SI joint to correct rotation   Soft tissue mobilization right piriformis and gluteal in  left sidely   Muscle Energy Technique to correct anterior rotated right ilium                PT Education - 11/18/15 0954    Education provided Yes   Education Details correction of pelvis; strengthening of right gluteal   Person(s) Educated Patient   Methods Explanation;Demonstration;Verbal cues;Handout   Comprehension Returned demonstration;Verbalized understanding          PT Short Term Goals - 11/08/15 1056  PT SHORT TERM GOAL #1   Title pt will be independent in initial HEP   Time 4   Period Weeks   Status Achieved     PT SHORT TERM GOAL #2   Title Pt will increase Rt hip strength to 4/5 to improve ability to work without pain   Time 4   Period Weeks   Status Achieved     PT SHORT TERM GOAL #3   Title Pt will sleep x 5 hours without waking due to pain   Time 4   Period Weeks   Status Achieved           PT Long Term Goals - 11/18/15 1008      PT LONG TERM GOAL #1   Title FOTO will improve to < 39% to demo improved funcitonal mobility   Period Weeks   Status On-going     PT LONG TERM GOAL #2   Title pt will report LE pain decrease by 50% in intensity or frequency   Baseline 20%   Time 8   Period Weeks   Status Achieved     PT LONG TERM GOAL #3   Title Pt will sleep through the night 75% of the time  without waking due to pain   Time 4   Period Weeks   Status Achieved     PT LONG TERM GOAL #4   Title Pt will be independent in advanced HEP   Baseline 10 minutes, some days less   Time 8   Period Weeks   Status On-going     PT LONG TERM GOAL #5   Title Pt will improve Rt hip strength to 4+/5 to work without pain   Time 8   Period Weeks   Status Achieved     PT LONG TERM GOAL #6   Title sit for 15 min. to document with minimal to no pain   Time 8   Period Weeks   Status On-going  10 min     PT LONG TERM GOAL #7   Title ability to lift without difficulty at work due to increased core strength   Time 8   Period Weeks   Status On-going  increased achiness in right SI joint     PT LONG TERM GOAL #8   Title ability to place full weight on right lower extremity during work tasks due to pelvis in correct alignment and reduction in muscle spasms   Time 8   Period Weeks   Status New     PT LONG TERM GOAL  #9   TITLE return to dance class due to back pain and right buttock pain decreased >/= 75%   Time 8   Period Weeks   Status New               Plan - 11/18/15 1011    Clinical Impression Statement Patient was working over the weekend with no restrictions.  She has increased achiness in right SI joint.  Patient had spasm in right piriformis. Right ilium is rotated and corrected with self muscle energy.  Patient will benefit from physical therapy to reduce pain.    Rehab Potential Good   Clinical Impairments Affecting Rehab Potential none   PT Frequency 2x / week   PT Duration 8 weeks   PT Treatment/Interventions Electrical Stimulation;Cryotherapy;Iontophoresis 4mg /ml Dexamethasone;Moist Heat;Traction;Therapeutic activities;Therapeutic exercise;Balance training;Patient/family education;Functional mobility training;Gait training;Neuromuscular re-education;Passive range of motion;Dry needling;Taping;Manual techniques   PT Next Visit Plan Core strength and  Rt gluteal  strength; correct pelvis; modalities as needed   PT Home Exercise Plan physioball exercises   Consulted and Agree with Plan of Care Patient      Patient will benefit from skilled therapeutic intervention in order to improve the following deficits and impairments:  Decreased activity tolerance, Pain, Decreased strength, Impaired flexibility, Increased muscle spasms  Visit Diagnosis: Midline low back pain with right-sided sciatica  Cramp and spasm  Muscle weakness (generalized)     Problem List There are no active problems to display for this patient.   Eulis Foster, PT 11/18/15 10:14 AM   [facility] [address] [place], [place], [address] Phone: [phone]   Fax:  [phone]  Name: Karen Rowe MRN: [id number] Date of Birth: June 28, 1990

## 2015-11-18 NOTE — Patient Instructions (Addendum)
Lay on back with towel roll along the right SI joint, bring both knees to the right, hold for 30 sec.  Then do the self correction of her pelvis by pressing the right knee into your hand.     Extension: Resisted    Sitting backward in chair with resistive band held against chair back and looped around upper body, lean back against resistance of band. Repeat ____ times per set. Do ____ sets per session. Do ____ sessions per day.  http://orth.exer.us/180   Copyright  VHI. All rights reserved.  Quadruped: Hip Extension / Knee Flexion (Active)    On hands and knees, lift right leg with knee straight. Bend knee. Use _0__ lbs. Complete _1_ sets of _10__ repetitions. Perform _1__ sessions per day. Make sure pelvis stays leveled.  Go slowly.  Contract the right gluteals.  Copyright  VHI. All rights reserved.  [facility] [address], [address] [place], [place] [address] Phone # [phone] Fax [phone]

## 2015-11-21 ENCOUNTER — Ambulatory Visit: Payer: PRIVATE HEALTH INSURANCE | Admitting: Physical Therapy

## 2015-11-27 ENCOUNTER — Ambulatory Visit: Payer: PRIVATE HEALTH INSURANCE

## 2015-11-27 DIAGNOSIS — M5441 Lumbago with sciatica, right side: Secondary | ICD-10-CM

## 2015-11-27 DIAGNOSIS — M6281 Muscle weakness (generalized): Secondary | ICD-10-CM

## 2015-11-27 DIAGNOSIS — R252 Cramp and spasm: Secondary | ICD-10-CM

## 2015-11-27 NOTE — Therapy (Signed)
[facility] [address], [address] [place], [place], [address] Phone: [phone]   Fax:  [phone]  Physical Therapy Treatment  Patient Details  Name: Karen LinerJulia A Bielak MRN: [id number] Date of Birth: 03-27-1991 Referring Provider: Dr. Timmothy EulerMark Dunomski  Encounter Date: 11/27/2015      PT End of Session - 11/27/15 0845    Visit Number 8   Number of Visits 12   Date for PT Re-Evaluation 12/16/15   Authorization Type W/C   Authorization - Visit Number 8   Authorization - Number of Visits 12   PT Start Time 0803   PT Stop Time 0904   PT Time Calculation (min) 61 min   Activity Tolerance Patient tolerated treatment well   Behavior During Therapy [facility] for tasks assessed/performed      Past Medical History:  Diagnosis Date  . Migraines     History reviewed. No pertinent surgical history.  There were no vitals filed for this visit.      Subjective Assessment - 11/27/15 0809    Subjective No restrictions at work now and pain has increased again.     Currently in Pain? Yes   Pain Score 6    Pain Location Buttocks   Pain Orientation Right   Pain Descriptors / Indicators Aching;Tightness   Pain Type Chronic pain   Pain Onset More than a month ago   Pain Frequency Constant   Aggravating Factors  sitting more than 10 minutes, working full duty   Pain Relieving Factors rest                         [facility] Adult PT Treatment/Exercise - 11/27/15 0001      Lumbar Exercises: Stretches   Single Knee to Chest Stretch 3 reps;30 seconds  bil.    Piriformis Stretch 3 reps;20 seconds     Modalities   Modalities Electrical Stimulation     Moist Heat Therapy   Number Minutes Moist Heat 15 Minutes   Moist Heat Location Lumbar Spine;Hip  in left sidely     Electrical Stimulation   Electrical Stimulation Location lumbar gluteal  in left sidely   Electrical Stimulation Action IFC   Electrical Stimulation  Parameters 15 minutes   Electrical Stimulation Goals Pain     Ultrasound   Ultrasound Location Rt gluteals   Ultrasound Parameters 1.0 w/cm2 50% pulsed x 6 minutes   Ultrasound Goals Pain     Manual Therapy   Manual Therapy Soft tissue mobilization;Joint mobilization;Muscle Energy Technique   Soft tissue mobilization right piriformis and gluteal in  left sidelying          Trigger Point Dry Needling - 11/27/15 [id number]    Consent Given? Yes   Education Handout Provided Yes   Muscles Treated Lower Body Gluteus minimus;Gluteus maximus;Piriformis  Rt only   Gluteus Maximus Response Twitch response elicited;Palpable increased muscle length   Gluteus Minimus Response Twitch response elicited;Palpable increased muscle length   Piriformis Response Twitch response elicited;Palpable increased muscle length              PT Education - 11/27/15 0813    Education provided Yes   Education Details DN info   Person(s) Educated Patient   Methods Explanation;Demonstration;Handout   Comprehension Verbalized understanding;Returned demonstration          PT Short Term Goals - 11/08/15 1056      PT SHORT TERM GOAL #1   Title pt will  be independent in initial HEP   Time 4   Period Weeks   Status Achieved     PT SHORT TERM GOAL #2   Title Pt will increase Rt hip strength to 4/5 to improve ability to work without pain   Time 4   Period Weeks   Status Achieved     PT SHORT TERM GOAL #3   Title Pt will sleep x 5 hours without waking due to pain   Time 4   Period Weeks   Status Achieved           PT Long Term Goals - 11/27/15 [id number]      PT LONG TERM GOAL #4   Title Pt will be independent in advanced HEP   Time 8   Period Weeks   Status On-going     PT LONG TERM GOAL #6   Title sit for 15 min. to document with minimal to no pain   Time 8   Period Weeks   Status On-going  3-5 minutes     PT LONG TERM GOAL #7   Title ability to lift without difficulty at work due to  increased core strength   Time 8   Period Weeks   Status On-going               Plan - 11/27/15 0841    Clinical Impression Statement Pt returned to full duty work and pain has flared up.  Pt reports 6/10 Rt gluteal pain and sitting to chart is limited to 3-5 minutes this week.   Pt with active trigger points in Rt glueals and demonstrated improved tissue mobility after manual therapy and needling.  Pt will continue to benefit from skilled PT for Rt gluteal/core strength progression, flexibility, and manual/modalities as needed.     Rehab Potential Good   PT Frequency 2x / week   PT Duration 8 weeks   PT Treatment/Interventions Electrical Stimulation;Cryotherapy;Iontophoresis 4mg /ml Dexamethasone;Moist Heat;Traction;Therapeutic activities;Therapeutic exercise;Balance training;Patient/family education;Functional mobility training;Gait training;Neuromuscular re-education;Passive range of motion;Dry needling;Taping;Manual techniques   PT Next Visit Plan Core strength and Rt gluteal strength; correct pelvis as needed; modalities as needed.  Assess response to dry needling   Recommended Other Services SI belt to be worn at work for support/pain relief   Consulted and Agree with Plan of Care Patient      Patient will benefit from skilled therapeutic intervention in order to improve the following deficits and impairments:  Decreased activity tolerance, Pain, Decreased strength, Impaired flexibility, Increased muscle spasms  Visit Diagnosis: Midline low back pain with right-sided sciatica  Cramp and spasm  Muscle weakness (generalized)     Problem List There are no active problems to display for this patient.    Lorrene ReidKelly Trevaun Rendleman, PT 11/27/15 8:51 AM  [facility] [address], [address] [place], [place], [address] Phone: [phone]   Fax:  [phone]  Name: Karen LinerJulia A Falletta MRN: [id number] Date of Birth: 11-26-1990

## 2015-11-27 NOTE — Patient Instructions (Addendum)

## 2015-12-02 ENCOUNTER — Encounter: Payer: Self-pay | Admitting: Physical Therapy

## 2015-12-06 ENCOUNTER — Ambulatory Visit: Payer: PRIVATE HEALTH INSURANCE | Admitting: Physical Therapy

## 2015-12-06 DIAGNOSIS — M6281 Muscle weakness (generalized): Secondary | ICD-10-CM

## 2015-12-06 DIAGNOSIS — M5441 Lumbago with sciatica, right side: Secondary | ICD-10-CM | POA: Diagnosis not present

## 2015-12-06 DIAGNOSIS — R252 Cramp and spasm: Secondary | ICD-10-CM

## 2015-12-06 DIAGNOSIS — M5442 Lumbago with sciatica, left side: Secondary | ICD-10-CM

## 2015-12-06 NOTE — Therapy (Signed)
[facility] [address], [address] [place], [place], [address] Phone: [phone]   Fax:  [phone]  Physical Therapy Treatment  Patient Details  Name: Karen Rowe MRN: [id number] Date of Birth: [birth year] Referring Provider: Dr. Timmothy EulerMark Dunomski  Encounter Date: 12/06/2015      PT End of Session - 12/06/15 1054    Visit Number 9   Number of Visits 12   Date for PT Re-Evaluation 12/16/15   Authorization Type W/C   PT Start Time 0851   PT Stop Time 0945   PT Time Calculation (min) 54 min   Activity Tolerance Patient tolerated treatment well      Past Medical History:  Diagnosis Date  . Migraines     No past surgical history on file.  There were no vitals filed for this visit.      Subjective Assessment - 12/06/15 0854    Subjective Today is not a good day and not a good week.   Only a few hours relief from dry needling then back to regular intensity.  Right > left low back, thighs and posterior calves.     Currently in Pain? Yes   Pain Score 7    Pain Location Back   Pain Orientation Right;Left                         [facility] Adult PT Treatment/Exercise - 12/06/15 0001      Lumbar Exercises: Supine   Ab Set 5 reps   Bent Knee Raise 10 reps   Isometric Hip Flexion 10 reps   Other Supine Lumbar Exercises ab brace with ball squeeze 8x   Other Supine Lumbar Exercises ab brace with red band abd/Er isometric     Moist Heat Therapy   Number Minutes Moist Heat 15 Minutes   Moist Heat Location Lumbar Spine;Hip  in left sidely     Electrical Stimulation   Electrical Stimulation Location lumbar gluteal  in left sidely   Electrical Stimulation Action IFC   Electrical Stimulation Parameters 15 min 10 ma   Electrical Stimulation Goals Pain     Manual Therapy   Soft tissue mobilization bilateral lumbar paraspinals, gluteals and piriformis          Trigger Point Dry Needling - 12/06/15 1208     Consent Given? Yes   Muscles Treated Lower Body --  bilateral lumbar multifidi;  right quadruatus lumborum   Gluteus Maximus Response Twitch response elicited;Palpable increased muscle length   Piriformis Response Twitch response elicited;Palpable increased muscle length                PT Short Term Goals - 12/06/15 1204      PT SHORT TERM GOAL #1   Title pt will be independent in initial HEP   Status Achieved     PT SHORT TERM GOAL #2   Title Pt will increase Rt hip strength to 4/5 to improve ability to work without pain   Status Achieved     PT SHORT TERM GOAL #3   Title Pt will sleep x 5 hours without waking due to pain   Status Achieved           PT Long Term Goals - 12/06/15 1205      PT LONG TERM GOAL #1   Title FOTO will improve to < 39% to demo improved funcitonal mobility   Time 8   Period Weeks   Status  On-going     PT LONG TERM GOAL #2   Title pt will report LE pain decrease by 50% in intensity or frequency   Time 8   Status Achieved     PT LONG TERM GOAL #3   Title Pt will sleep through the night 75% of the time without waking due to pain   Status Achieved     PT LONG TERM GOAL #4   Title Pt will be independent in advanced HEP   Time 8   Period Weeks   Status On-going     PT LONG TERM GOAL #5   Title Pt will improve Rt hip strength to 4+/5 to work without pain     PT LONG TERM GOAL #6   Title sit for 15 min. to document with minimal to no pain   Time 8   Period Weeks   Status On-going     PT LONG TERM GOAL #7   Title ability to lift without difficulty at work due to increased core strength   Time 8   Period Weeks   Status On-going     PT LONG TERM GOAL #8   Title ability to place full weight on right lower extremity during work tasks due to pelvis in correct alignment and reduction in muscle spasms   Time 8   Period Weeks   Status On-going     PT LONG TERM GOAL  #9   TITLE return to dance class due to back pain and right  buttock pain decreased >/= 75%   Time 8   Period Weeks   Status On-going               Plan - 12/06/15 1158    Clinical Impression Statement The patient continues to complain of 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  She continues to have bilateral peripheral symptoms.  She was able to participate in low level abdominal brace/core activation of transverse abdominals with minimal exacerbation of pain.  She does note right LE perception of weakness with bent knee lowering.  Verbal and tactile cues for coordination of breathing with exercise.  Tender points in lumbar multifidi and tightness in right quadratus lumborum improved following treatment interventions.  Some improvement in pain with e-stim/heat.     PT Next Visit Plan assess response to dry needling and possibly add e-stim with DN to lumbar multifidi;  manual therapy;  core stabilization progression; modalities for pain control      Patient will benefit from skilled therapeutic intervention in order to improve the following deficits and impairments:     Visit Diagnosis: Midline low back pain with right-sided sciatica  Cramp and spasm  Muscle weakness (generalized)  Bilateral low back pain with sciatica, sciatica laterality unspecified     Problem List There are no active problems to display for this patient.    Karen Rowe, PT 12/06/15 12:09 PM Phone: [phone] Fax: [phone]  Karen Rowe 12/06/2015, 12:09 PM  [facility] [address], [address] [place], [place], [address] Phone: [phone]   Fax:  [phone]  Name: Karen Rowe MRN: [id number] Date of Birth: 1990-07-14

## 2015-12-06 NOTE — Therapy (Signed)
[facility] [address], [address] [place], [place], [address] Phone: [phone]   Fax:  [phone]  Physical Therapy Treatment  Patient Details  Name: Karen Rowe MRN: [id number] Date of Birth: [birth year] Referring Provider: Dr. Timmothy EulerMark Dunomski  Encounter Date: 12/06/2015      PT End of Session - 12/06/15 1054    Visit Number 9   Number of Visits 12   Date for PT Re-Evaluation 12/16/15   Authorization Type W/C   PT Start Time 0851   PT Stop Time 0945   PT Time Calculation (min) 54 min   Activity Tolerance Patient tolerated treatment well      Past Medical History:  Diagnosis Date  . Migraines     No past surgical history on file.  There were no vitals filed for this visit.      Subjective Assessment - 12/06/15 0854    Subjective Today is not a good day and not a good week.   Only a few hours relief from dry needling then back to regular intensity.  Right > left low back, thighs and posterior calves.     Currently in Pain? Yes   Pain Score 7    Pain Location Back   Pain Orientation Right;Left                         [facility] Adult PT Treatment/Exercise - 12/06/15 0001      Lumbar Exercises: Supine   Ab Set 5 reps   Bent Knee Raise 10 reps   Isometric Hip Flexion 10 reps   Other Supine Lumbar Exercises ab brace with ball squeeze 8x   Other Supine Lumbar Exercises ab brace with red band abd/Er isometric     Moist Heat Therapy   Number Minutes Moist Heat 15 Minutes   Moist Heat Location Lumbar Spine;Hip  in left sidely     Electrical Stimulation   Electrical Stimulation Location lumbar gluteal  in left sidely   Electrical Stimulation Action IFC   Electrical Stimulation Parameters 15 min 10 ma   Electrical Stimulation Goals Pain     Manual Therapy   Soft tissue mobilization bilateral lumbar paraspinals, gluteals and piriformis                  PT Short Term Goals -  12/06/15 1204      PT SHORT TERM GOAL #1   Title pt will be independent in initial HEP   Status Achieved     PT SHORT TERM GOAL #2   Title Pt will increase Rt hip strength to 4/5 to improve ability to work without pain   Status Achieved     PT SHORT TERM GOAL #3   Title Pt will sleep x 5 hours without waking due to pain   Status Achieved           PT Long Term Goals - 12/06/15 1205      PT LONG TERM GOAL #1   Title FOTO will improve to < 39% to demo improved funcitonal mobility   Time 8   Period Weeks   Status On-going     PT LONG TERM GOAL #2   Title pt will report LE pain decrease by 50% in intensity or frequency   Time 8   Status Achieved     PT LONG TERM GOAL #3   Title Pt will sleep through the night 75% of the time without  waking due to pain   Status Achieved     PT LONG TERM GOAL #4   Title Pt will be independent in advanced HEP   Time 8   Period Weeks   Status On-going     PT LONG TERM GOAL #5   Title Pt will improve Rt hip strength to 4+/5 to work without pain     PT LONG TERM GOAL #6   Title sit for 15 min. to document with minimal to no pain   Time 8   Period Weeks   Status On-going     PT LONG TERM GOAL #7   Title ability to lift without difficulty at work due to increased core strength   Time 8   Period Weeks   Status On-going     PT LONG TERM GOAL #8   Title ability to place full weight on right lower extremity during work tasks due to pelvis in correct alignment and reduction in muscle spasms   Time 8   Period Weeks   Status On-going     PT LONG TERM GOAL  #9   TITLE return to dance class due to back pain and right buttock pain decreased >/= 75%   Time 8   Period Weeks   Status On-going               Plan - 12/06/15 1158    Clinical Impression Statement The patient continues to complain of 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  She continues to have bilateral peripheral symptoms.  She was  able to participate in low level abdominal brace/core activation of transverse abdominals with minimal exacerbation of pain.  She does note right LE perception of weakness with bent knee lowering.  Verbal and tactile cues for coordination of breathing with exercise.  Tender points in lumbar multifidi and tightness in right quadratus lumborum improved following treatment interventions.  Some improvement in pain with e-stim/heat.     PT Next Visit Plan assess response to dry needling and possibly add e-stim with DN to lumbar multifidi;  manual therapy;  core stabilization progression; modalities for pain control      Patient will benefit from skilled therapeutic intervention in order to improve the following deficits and impairments:     Visit Diagnosis: Midline low back pain with right-sided sciatica  Cramp and spasm  Muscle weakness (generalized)  Bilateral low back pain with sciatica, sciatica laterality unspecified     Problem List There are no active problems to display for this patient.   Vivien Presto 12/06/2015, 12:07 PM  [facility] [address], [address] [place], [place], [address] Phone: [phone]   Fax:  [phone]  Name: Karen Rowe MRN: [id number] Date of Birth: [birth year]

## 2015-12-11 ENCOUNTER — Ambulatory Visit: Payer: PRIVATE HEALTH INSURANCE | Admitting: Physical Therapy

## 2015-12-11 ENCOUNTER — Encounter: Payer: Self-pay | Admitting: Physical Therapy

## 2015-12-11 DIAGNOSIS — M5442 Lumbago with sciatica, left side: Secondary | ICD-10-CM

## 2015-12-11 DIAGNOSIS — M6281 Muscle weakness (generalized): Secondary | ICD-10-CM

## 2015-12-11 DIAGNOSIS — R252 Cramp and spasm: Secondary | ICD-10-CM

## 2015-12-11 DIAGNOSIS — M5441 Lumbago with sciatica, right side: Secondary | ICD-10-CM

## 2015-12-11 NOTE — Therapy (Signed)
[facility] [address], [address] [place], [place], [address] Phone: [phone]   Fax:  [phone]  Physical Therapy Treatment  Patient Details  Name: Karen Rowe MRN: [id number] Date of Birth: 03-11-1991 Referring Provider: Dr. Timmothy EulerMark Dunomski  Encounter Date: 12/11/2015      PT End of Session - 12/11/15 0938    Visit Number 10   Number of Visits 12   Date for PT Re-Evaluation 12/16/15   Authorization Type W/C   Authorization - Visit Number 10   Authorization - Number of Visits 12   PT Start Time 0934   PT Stop Time 1030   PT Time Calculation (min) 56 min   Activity Tolerance Patient tolerated treatment well   Behavior During Therapy [facility] for tasks assessed/performed      Past Medical History:  Diagnosis Date  . Migraines     History reviewed. No pertinent surgical history.  There were no vitals filed for this visit.      Subjective Assessment - 12/11/15 0935    Subjective My RT side hurts contsantly, LT side now hurts > RT. I am going to receive another injection to both sides of my back.    Currently in Pain? Yes   Pain Score 7    Pain Location Back   Pain Orientation Left;Right;Lower   Pain Descriptors / Indicators Constant;Sharp   Aggravating Factors  work, sitting   Pain Relieving Factors Sidelying LT   Multiple Pain Sites No                         [facility] Adult PT Treatment/Exercise - 12/11/15 0001      Lumbar Exercises: Stretches   Piriformis Stretch --  Piriformins release with ball in supine     Lumbar Exercises: Supine   Ab Set --  Ball squeeze with TA contraction 10x   Clam 10 reps   Clam Limitations Difficulty keeping sit bones wide with hip ER   Bent Knee Raise 10 reps   Bent Knee Raise Limitations Bil with VC to engage core on the decscent     Lumbar Exercises: Sidelying   Clam 10 reps   Clam Limitations VC to lengthen the sides of her waist which was very  difficult   Hip Abduction 10 reps   Hip Abduction Limitations RT only: VC to lengthen the top leg/pelvis     Lumbar Exercises: Quadruped   Single Arm Raise Right;Left;5 reps;1 second   Single Arm Raise Weights (lbs) Pt had red physio ball behind her.     Producer, television/film/videolectrical Stimulation   Electrical Stimulation Location Bil lumbar   Electrical Stimulation Action IFC   Electrical Stimulation Parameters 15 min   Electrical Stimulation Goals Pain                  PT Short Term Goals - 12/06/15 1204      PT SHORT TERM GOAL #1   Title pt will be independent in initial HEP   Status Achieved     PT SHORT TERM GOAL #2   Title Pt will increase Rt hip strength to 4/5 to improve ability to work without pain   Status Achieved     PT SHORT TERM GOAL #3   Title Pt will sleep x 5 hours without waking due to pain   Status Achieved           PT Long Term Goals - 12/11/15 0940  PT LONG TERM GOAL #1   Title FOTO will improve to < 39% to demo improved funcitonal mobility   Time 8   Period Weeks   Status --  Pt scored 64% limitation which is worse               Plan - 12/11/15 0939    Clinical Impression Statement Pt appeared to have difficulty not compressing through the pelvis and SI joints when performing her stabs. This was difficult to do but definitely improved, making the quality of her exercise better. Pain was much less after exercising.    Rehab Potential Good   Clinical Impairments Affecting Rehab Potential none   PT Frequency 2x / week   PT Treatment/Interventions Electrical Stimulation;Cryotherapy;Iontophoresis 4mg /ml Dexamethasone;Moist Heat;Traction;Therapeutic activities;Therapeutic exercise;Balance training;Patient/family education;Functional mobility training;Gait training;Neuromuscular re-education;Passive range of motion;Dry needling;Taping;Manual techniques   PT Next Visit Plan Core strength, Rt hip strength keeping her "sits bones wide."       Patient will benefit from skilled therapeutic intervention in order to improve the following deficits and impairments:  Decreased activity tolerance, Pain, Decreased strength, Impaired flexibility, Increased muscle spasms  Visit Diagnosis: Midline low back pain with right-sided sciatica  Cramp and spasm  Muscle weakness (generalized)  Bilateral low back pain with sciatica, sciatica laterality unspecified     Problem List There are no active problems to display for this patient.   COCHRAN,JENNIFER, PTA 12/11/2015, 10:19 AM  [facility] [address], [address] [place], [place], [address] Phone: [phone]   Fax:  [phone]  Name: Karen Rowe MRN: [id number] Date of Birth: 17-Oct-1990

## 2015-12-13 ENCOUNTER — Other Ambulatory Visit: Payer: Self-pay | Admitting: Orthopedic Surgery

## 2015-12-13 DIAGNOSIS — M533 Sacrococcygeal disorders, not elsewhere classified: Secondary | ICD-10-CM

## 2015-12-17 ENCOUNTER — Ambulatory Visit: Payer: PRIVATE HEALTH INSURANCE | Attending: Orthopedic Surgery | Admitting: Physical Therapy

## 2015-12-17 ENCOUNTER — Encounter: Payer: Self-pay | Admitting: Physical Therapy

## 2015-12-17 DIAGNOSIS — R252 Cramp and spasm: Secondary | ICD-10-CM | POA: Diagnosis not present

## 2015-12-17 DIAGNOSIS — M5442 Lumbago with sciatica, left side: Secondary | ICD-10-CM | POA: Insufficient documentation

## 2015-12-17 DIAGNOSIS — M6281 Muscle weakness (generalized): Secondary | ICD-10-CM | POA: Diagnosis present

## 2015-12-17 DIAGNOSIS — M5441 Lumbago with sciatica, right side: Secondary | ICD-10-CM

## 2015-12-17 NOTE — Therapy (Signed)
[facility] [address], [place], [place], [address] Phone: [phone]   Fax:  [phone]  Physical Therapy Treatment  Patient Details  Name: Karen Rowe MRN: [id number] Date of Birth: 03-13-1991 Referring Provider: Dr. Connye Burkitt  Encounter Date: 12/17/2015      PT End of Session - 12/17/15 0939    Visit Number 11   Number of Visits 20   Date for PT Re-Evaluation 02/11/16   Authorization Type W/C   Authorization - Visit Number 11   Authorization - Number of Visits 20   PT Start Time 0930   PT Stop Time 1030   PT Time Calculation (min) 60 min   Activity Tolerance Patient tolerated treatment well   Behavior During Therapy [facility] for tasks assessed/performed      Past Medical History:  Diagnosis Date  . Migraines     History reviewed. No pertinent surgical history.  There were no vitals filed for this visit.      Subjective Assessment - 12/17/15 0935    Subjective I saw the doctor and he is going to do injections.    Limitations Sitting   How long can you sit comfortably? 60   Patient Stated Goals be able to sit and lay down without pain, work without pain   Currently in Pain? Yes   Pain Score 6    Pain Location Back   Pain Orientation Right   Pain Descriptors / Indicators Aching   Pain Type Chronic pain   Pain Radiating Towards right leg   Pain Onset More than a month ago   Pain Frequency Constant   Aggravating Factors  work and sitting   Pain Relieving Factors sidelying left   Multiple Pain Sites No            [facility] PT Assessment - 12/17/15 0001      Assessment   Medical Diagnosis SI joint pain   Referring Provider Dr. Connye Burkitt   Onset Date/Surgical Date 05/04/15   Prior Therapy yes, prior to injection     Precautions   Precautions None     Restrictions   Weight Bearing Restrictions No     Balance Screen   Has the patient fallen in the past 6 months No   Has the  patient had a decrease in activity level because of a fear of falling?  No   Is the patient reluctant to leave their home because of a fear of falling?  No     Home Ecologist residence     Prior Function   Level of Independence Independent     Cognition   Overall Cognitive Status Within Functional Limits for tasks assessed     Observation/Other Assessments   Focus on Therapeutic Outcomes (FOTO)  64% limitation  goal is 39% limitation     Palpation   Spinal mobility pain with P-A movement on the right,    SI assessment  sacrum rotated left, right PSIS is shallow   Palpation comment palpable tenderness located in right gluteals                     [facility] Adult PT Treatment/Exercise - 12/17/15 0001      Exercises   Exercises Other Exercises   Other Exercises  sit on ball, hold red plyoball and move up/down and diagonals focusing on core strength     Lumbar Exercises: Stretches   Piriformis Stretch --  Piriformins release with ball in supine     Lumbar Exercises: Supine   Ab Set --  Ball squeeze with TA contraction 10x   Clam 10 reps  with yellow band   Clam Limitations feels better with band   Bent Knee Raise 10 reps   Bent Knee Raise Limitations used yellow band     Lumbar Exercises: Sidelying   Hip Abduction 10 reps  right, roll under right foot to isolate movement   Hip Abduction Limitations RT only: VC to lengthen the top leg/pelvis     Lumbar Exercises: Quadruped   Single Arm Raise Right;Left;5 reps;1 second   Single Arm Raise Weights (lbs) tactile cues on the right abdominals due to weakness     Modalities   Modalities Electrical Stimulation;Moist Heat     Moist Heat Therapy   Number Minutes Moist Heat 15 Minutes   Moist Heat Location Lumbar Spine     Electrical Stimulation   Electrical Stimulation Location Bil lumbar   Electrical Stimulation Action IFC   Electrical Stimulation Parameters 15 min, to patient  tolerance   Electrical Stimulation Goals Pain                PT Education - 12/17/15 1003    Education provided No          PT Short Term Goals - 12/17/15 0940      PT SHORT TERM GOAL #1   Title pt will be independent in initial HEP   Time 4   Period Weeks   Status Achieved     PT SHORT TERM GOAL #2   Title Pt will increase Rt hip strength to 4/5 to improve ability to work without pain   Time 4   Period Weeks   Status Achieved     PT SHORT TERM GOAL #3   Title Pt will sleep x 5 hours without waking due to pain   Time 4   Period Weeks   Status Achieved           PT Long Term Goals - 12/17/15 0940      PT LONG TERM GOAL #1   Title FOTO will improve to < 39% to demo improved funcitonal mobility   Time 8   Period Weeks   Status On-going     PT LONG TERM GOAL #2   Title pt will report LE pain decrease by 50% in intensity or frequency   Baseline 20%   Time 8   Period Weeks   Status Not Met  pain increased, left side hurts now     PT LONG TERM GOAL #3   Title Pt will sleep through the night 75% of the time without waking due to pain   Time 4   Period Weeks   Status Achieved     PT LONG TERM GOAL #4   Title Pt will be independent in advanced HEP   Baseline 10 minutes, some days less   Time 8   Period Weeks   Status On-going     PT LONG TERM GOAL #5   Title Pt will improve Rt hip strength to 4+/5 to work without pain   Time 8   Period Weeks   Status Achieved     PT LONG TERM GOAL #6   Title sit for 15 min. to document with minimal to no pain   Time 8   Period Weeks   Status On-going  5 min     PT LONG  TERM GOAL #7   Title ability to lift without difficulty at work due to increased core strength   Time 8   Period Weeks   Status On-going  light duty     PT [place] #8   Title ability to place full weight on right lower extremity during work tasks due to pelvis in correct alignment and reduction in muscle spasms   Time 8    Period Weeks   Status On-going     PT LONG TERM GOAL  #9   TITLE return to dance class due to back pain and right buttock pain decreased >/= 75%   Time 8   Period Weeks   Status On-going               Plan - 12/17/15 1003    Clinical Impression Statement Patient has met her STG's.  Patient is on light duty.  She tried regular duty but increased her pain.  Patient right hip strength is 5/5.  Lumbar strength is 3/5.  Patient has difficulty contracting the right abdominals and left lumbar paraspinals.  Patient has constant right leg pain at level 6/10 and intermittent left leg pain at level 8/10.  Patient is only able to sit for 5 min.  Patient takes longer to sleep.  Patient FOTO score went form 53% limitation to 64% limitation due to increased pain for the past few weeks.  Patient will benefit from physical therapy to increase core strength and reduce pain to functional tasks.    Rehab Potential Good   Clinical Impairments Affecting Rehab Potential none   PT Frequency 2x / week   PT Duration 8 weeks   PT Treatment/Interventions Electrical Stimulation;Cryotherapy;Iontophoresis 37m/ml Dexamethasone;Moist Heat;Traction;Therapeutic activities;Therapeutic exercise;Balance training;Patient/family education;Functional mobility training;Gait training;Neuromuscular re-education;Passive range of motion;Dry needling;Taping;Manual techniques   PT Next Visit Plan Core strength, Rt hip strength keeping her "sits bones wide."   PT Home Exercise Plan physioball exercises   Consulted and Agree with Plan of Care Patient      Patient will benefit from skilled therapeutic intervention in order to improve the following deficits and impairments:  Decreased activity tolerance, Pain, Decreased strength, Impaired flexibility, Increased muscle spasms  Visit Diagnosis: Cramp and spasm - Plan: PT plan of care cert/re-cert  Muscle weakness (generalized) - Plan: PT plan of care cert/re-cert  Bilateral low  back pain with sciatica, sciatica laterality unspecified - Plan: PT plan of care cert/re-cert     Problem List There are no active problems to display for this patient.   CEarlie Counts PT 12/17/15 10:14 AM   [facility] [address] [place] [address]Phone: [phone]  Fax:  [phone] Name: JCAREY LAFONMRN: [id number] of Birth: 1Aug 09, 1992

## 2015-12-19 DIAGNOSIS — Z01419 Encounter for gynecological examination (general) (routine) without abnormal findings: Secondary | ICD-10-CM | POA: Diagnosis not present

## 2015-12-19 DIAGNOSIS — Z6829 Body mass index (BMI) 29.0-29.9, adult: Secondary | ICD-10-CM | POA: Diagnosis not present

## 2015-12-25 ENCOUNTER — Encounter: Payer: Self-pay | Admitting: Physical Therapy

## 2015-12-25 ENCOUNTER — Ambulatory Visit: Payer: PRIVATE HEALTH INSURANCE | Admitting: Physical Therapy

## 2015-12-25 DIAGNOSIS — M5442 Lumbago with sciatica, left side: Secondary | ICD-10-CM

## 2015-12-25 DIAGNOSIS — M5441 Lumbago with sciatica, right side: Secondary | ICD-10-CM

## 2015-12-25 DIAGNOSIS — R252 Cramp and spasm: Secondary | ICD-10-CM

## 2015-12-25 DIAGNOSIS — M6281 Muscle weakness (generalized): Secondary | ICD-10-CM

## 2015-12-25 NOTE — Therapy (Signed)
[facility] [address], [address] [place], [place], [address] Phone: [phone]   Fax:  [phone]  Physical Therapy Treatment  Patient Details  Name: Karen Rowe MRN: [id number] Date of Birth: 25-Aug-1990 Referring Provider: Dr. Timmothy Euler  Encounter Date: 12/25/2015      PT End of Session - 12/25/15 0854    Visit Number 12   Number of Visits 20   Date for PT Re-Evaluation 02/11/16   Authorization Type W/C   Authorization - Visit Number 12   Authorization - Number of Visits 20   PT Start Time 0850   PT Stop Time 0947   PT Time Calculation (min) 57 min   Activity Tolerance Patient tolerated treatment well   Behavior During Therapy [facility] for tasks assessed/performed      Past Medical History:  Diagnosis Date  . Migraines     History reviewed. No pertinent surgical history.  There were no vitals filed for this visit.      Subjective Assessment - 12/25/15 0852    Subjective Good week, I only worked 1 day last week.   Currently in Pain? Yes   Pain Score 3    Pain Location Back   Pain Orientation Lower  Some thoracic pain    Pain Descriptors / Indicators Dull;Aching   Aggravating Factors  Work, sitting   Pain Relieving Factors Sidelying LT   Multiple Pain Sites --  A touch 1/10 of gluteal pain                         [facility] Adult PT Treatment/Exercise - 12/25/15 0001      Lumbar Exercises: Stretches   Piriformis Stretch --  Piriformins release with ball in supine     Lumbar Exercises: Aerobic   Elliptical R 0 L3 x 6 min     Lumbar Exercises: Supine   Clam --  In supine first 10x with orange ball   Bent Knee Raise 10 reps  orange ball under RT glute     Lumbar Exercises: Sidelying   Clam 20 reps   Clam Limitations yellow band less VC for core   Hip Abduction 20 reps   Hip Abduction Weights (lbs) 0   Other Sidelying Lumbar Exercises S-L lef lift with leg in ER 2x5  VERY  difficult     Lumbar Exercises: Quadruped   Single Arm Raise Right;Left;2 seconds  2x5 sitting back into red ball     Cryotherapy   Number Minutes Cryotherapy 15 Minutes   Cryotherapy Location Lumbar Spine   Type of Cryotherapy Ice pack     Electrical Stimulation   Electrical Stimulation Location Lumbar to distal glutes   Electrical Stimulation Action IFC   Electrical Stimulation Goals Pain                  PT Short Term Goals - 12/17/15 0940      PT SHORT TERM GOAL #1   Title pt will be independent in initial HEP   Time 4   Period Weeks   Status Achieved     PT SHORT TERM GOAL #2   Title Pt will increase Rt hip strength to 4/5 to improve ability to work without pain   Time 4   Period Weeks   Status Achieved     PT SHORT TERM GOAL #3   Title Pt will sleep x 5 hours without waking due to pain   Time  4   Period Weeks   Status Achieved           PT Long Term Goals - 12/25/15 [id number]      PT LONG TERM GOAL #4   Title Pt will be independent in advanced HEP   Time 8   Period Weeks   Status On-going     PT LONG TERM GOAL #6   Title sit for 15 min. to document with minimal to no pain   Time 8   Period Weeks   Status On-going     PT LONG TERM GOAL #7   Title ability to lift without difficulty at work due to increased core strength   Time 8   Period Weeks   Status On-going     PT LONG TERM GOAL  #9   TITLE return to dance class due to back pain and right buttock pain decreased >/= 75%   Time 8   Period Weeks   Status On-going               Plan - 12/25/15 0855    Clinical Impression Statement Pt had a very difficult time performing RT hip exercises when hip is in external rotation. Pain was very low today due to not doing a lot of lifting at work, pt's have been low level. Improved quality of core contraction today.    Rehab Potential Good   Clinical Impairments Affecting Rehab Potential none   PT Frequency 2x / week   PT Duration 8  weeks   PT Treatment/Interventions Electrical Stimulation;Cryotherapy;Iontophoresis 4mg /ml Dexamethasone;Moist Heat;Traction;Therapeutic activities;Therapeutic exercise;Balance training;Patient/family education;Functional mobility training;Gait training;Neuromuscular re-education;Passive range of motion;Dry needling;Taping;Manual techniques   PT Next Visit Plan Core, RT hip strength with hip in ER   Consulted and Agree with Plan of Care Patient      Patient will benefit from skilled therapeutic intervention in order to improve the following deficits and impairments:  Decreased activity tolerance, Pain, Decreased strength, Impaired flexibility, Increased muscle spasms  Visit Diagnosis: Cramp and spasm  Muscle weakness (generalized)  Bilateral low back pain with sciatica, sciatica laterality unspecified  Midline low back pain with right-sided sciatica     Problem List There are no active problems to display for this patient.   Rowe,Karen, PTA 12/25/2015, 9:37 AM  [facility] [address], [address] [place], [place], [address] Phone: [phone]   Fax:  [phone]  Name: Karen Rowe MRN: [id number] Date of Birth: [birth year]

## 2015-12-26 ENCOUNTER — Ambulatory Visit
Admission: RE | Admit: 2015-12-26 | Discharge: 2015-12-26 | Disposition: A | Discharge status: Home / Self Care | Payer: PRIVATE HEALTH INSURANCE | Source: Ambulatory Visit | Attending: Orthopedic Surgery | Admitting: Orthopedic Surgery

## 2015-12-26 DIAGNOSIS — M533 Sacrococcygeal disorders, not elsewhere classified: Secondary | ICD-10-CM

## 2015-12-26 MED ORDER — METHYLPREDNISOLONE ACETATE 40 MG/ML INJ SUSP (RADIOLOG
120.0000 mg | Freq: Once | INTRAMUSCULAR | Status: AC
  Administered 2015-12-26: 120 mg via INTRA_ARTICULAR

## 2015-12-26 MED ORDER — IOPAMIDOL (ISOVUE-M 200) INJECTION 41%
1.0000 mL | Freq: Once | INTRAMUSCULAR | Status: AC
  Administered 2015-12-26: 1 mL via INTRA_ARTICULAR

## 2015-12-27 ENCOUNTER — Ambulatory Visit: Payer: PRIVATE HEALTH INSURANCE | Admitting: Physical Therapy

## 2015-12-27 DIAGNOSIS — M6281 Muscle weakness (generalized): Secondary | ICD-10-CM

## 2015-12-27 DIAGNOSIS — R252 Cramp and spasm: Secondary | ICD-10-CM | POA: Diagnosis not present

## 2015-12-27 DIAGNOSIS — M5442 Lumbago with sciatica, left side: Secondary | ICD-10-CM

## 2015-12-27 DIAGNOSIS — M5441 Lumbago with sciatica, right side: Secondary | ICD-10-CM

## 2015-12-27 NOTE — Therapy (Signed)
[facility] [address], [address] [place], [place], [address] Phone: [phone]   Fax:  [phone]  Physical Therapy Treatment  Patient Details  Name: Karen Rowe MRN: [id number] Date of Birth: 22-May-1990 Referring Provider: Dr. Timmothy Euler  Encounter Date: 12/27/2015      PT End of Session - 12/27/15 0920    Visit Number 13   Number of Visits 20   Date for PT Re-Evaluation 02/11/16   Authorization Type W/Rowe   Authorization - Number of Visits 20   PT Start Time 0846   PT Stop Time 0934   PT Time Calculation (min) 48 min   Activity Tolerance Patient tolerated treatment well      Past Medical History:  Diagnosis Date  . Migraines     No past surgical history on file.  There were no vitals filed for this visit.      Subjective Assessment - 12/27/15 0855    Subjective Had an ESI yesterday.  Working today and all weekend.   Right buttock region.     Currently in Pain? Yes   Pain Score 5    Pain Location Back   Pain Orientation Right   Pain Type Chronic pain                         [facility] Adult PT Treatment/Exercise - 12/27/15 0001      Lumbar Exercises: Stretches   Active Hamstring Stretch 5 reps   Active Hamstring Stretch Limitations on/off with strap   Piriformis Stretch 5 reps   Piriformis Stretch Limitations LEs on ball,crossed over legs     Lumbar Exercises: Supine   Ab Set 10 reps   Isometric Hip Flexion 10 reps   Other Supine Lumbar Exercises ab brace with ball squeeze 8x     Lumbar Exercises: Prone   Other Prone Lumbar Exercises HS curls/femoral nerve floss 10x right/left   Other Prone Lumbar Exercises mini press ups 10x     Moist Heat Therapy   Number Minutes Moist Heat 15 Minutes   Moist Heat Location Lumbar Spine     Electrical Stimulation   Electrical Stimulation Location Bilateral lumbar    Electrical Stimulation Action IFC   Electrical Stimulation Parameters 15  min 9 ma   Electrical Stimulation Goals Pain                  PT Short Term Goals - 12/27/15 0929      PT SHORT TERM GOAL #1   Title pt will be independent in initial HEP   Status Achieved     PT SHORT TERM GOAL #2   Title Pt will increase Rt hip strength to 4/5 to improve ability to work without pain   Status Achieved     PT SHORT TERM GOAL #3   Title Pt will sleep x 5 hours without waking due to pain   Status Achieved           PT Long Term Goals - 12/27/15 0929      PT LONG TERM GOAL #1   Title FOTO will improve to < 39% to demo improved funcitonal mobility   Time 8   Period Weeks   Status On-going     PT LONG TERM GOAL #2   Title pt will report LE pain decrease by 50% in intensity or frequency   Time 8   Period Weeks   Status On-going  PT LONG TERM GOAL #3   Title Pt will sleep through the night 75% of the time without waking due to pain   Status Achieved     PT LONG TERM GOAL #4   Title Pt will be independent in advanced HEP   Time 8   Period Weeks   Status On-going     PT LONG TERM GOAL #5   Title Pt will improve Rt hip strength to 4+/5 to work without pain   Status Achieved     PT LONG TERM GOAL #6   Title sit for 15 min. to document with minimal to no pain   Time 8   Period Weeks   Status On-going     PT LONG TERM GOAL #7   Title ability to lift without difficulty at work due to increased core strength   Time 8   Period Weeks   Status On-going     PT LONG TERM GOAL #8   Title ability to place full weight on right lower extremity during work tasks due to pelvis in correct alignment and reduction in muscle spasms   Time 8   Period Weeks   Status On-going     PT LONG TERM GOAL  #9   TITLE return to dance class due to back pain and right buttock pain decreased >/= 75%   Time 8   Period Weeks   Status On-going               Plan - 12/27/15 0921    Clinical Impression Statement Treatment modified today secondary to  patient having ESI yesterday.  Treatment focus on gentle mobility, neural flossing and low level core stabilization.  Patient working today and over the weekend and we discussed limiting her lifting and strain in order to allow the injection to have optimal benefit.  Patient has had a good response to IFC electrical stimulation with heat in the course of treatment.  She would benefit from a home TENS unit for pain relief as well.  Therapist closely monitoring response with all treatment interventions.     PT Next Visit Plan resume core, right hip strength with hip in ER;  e-stim/heat as needed      Patient will benefit from skilled therapeutic intervention in order to improve the following deficits and impairments:     Visit Diagnosis: Cramp and spasm  Muscle weakness (generalized)  Bilateral low back pain with sciatica, sciatica laterality unspecified  Midline low back pain with right-sided sciatica     Problem List There are no active problems to display for this patient. Karen Rowe, PT 12/27/15 9:33 AM Phone: [phone] Fax: [phone]  Karen Rowe, Karen Rowe 12/27/2015, 9:32 AM  [facility] [address], [address] [place], [place], [address] Phone: [phone]   Fax:  [phone]  Name: Karen Rowe MRN: [id number] Date of Birth: Apr 20, 1990

## 2015-12-30 ENCOUNTER — Ambulatory Visit: Payer: PRIVATE HEALTH INSURANCE | Admitting: Physical Therapy

## 2015-12-31 ENCOUNTER — Encounter: Payer: Self-pay | Admitting: Physical Therapy

## 2015-12-31 ENCOUNTER — Ambulatory Visit: Payer: PRIVATE HEALTH INSURANCE | Admitting: Physical Therapy

## 2015-12-31 DIAGNOSIS — M5441 Lumbago with sciatica, right side: Secondary | ICD-10-CM

## 2015-12-31 DIAGNOSIS — R252 Cramp and spasm: Secondary | ICD-10-CM

## 2015-12-31 DIAGNOSIS — M5442 Lumbago with sciatica, left side: Secondary | ICD-10-CM

## 2015-12-31 DIAGNOSIS — M6281 Muscle weakness (generalized): Secondary | ICD-10-CM

## 2015-12-31 NOTE — Therapy (Signed)
[facility] [address], [address] [place], [place], [address] Phone: [phone]   Fax:  [phone]  Physical Therapy Treatment  Patient Details  Name: Karen Rowe MRN: [id number] Date of Birth: [birth year] Referring Provider: Dr. Timmothy EulerMark Dunomski  Encounter Date: 12/31/2015      PT End of Session - 12/31/15 1203    Visit Number 14   Number of Visits 20   Date for PT Re-Evaluation 02/11/16   Authorization Type W/C   Authorization - Visit Number 14   Authorization - Number of Visits 20   PT Start Time 1150   PT Stop Time 1250   PT Time Calculation (min) 60 min   Activity Tolerance Patient tolerated treatment well   Behavior [facility]/performed      Past Medical History:  Diagnosis Date  . Migraines     History reviewed. No pertinent surgical history.  There were no vitals filed for this visit.      Subjective Assessment - 12/31/15 1154    Subjective I feel pretty good. I have a little pain in right buttocks. I had an injection last Thursday. I can sleep better since the injection. Have a SI belt and used for the last 2-3 days and is helping.    Limitations Sitting   How long can you sit comfortably? 5 min   Patient Stated Goals be able to sit and lay down without pain, work without pain   Currently in Pain? Yes   Pain Score 3    Pain Location Buttocks   Pain Orientation Right   Pain Descriptors / Indicators Tightness   Pain Type Chronic pain   Pain Onset More than a month ago   Pain Frequency Constant   Aggravating Factors  sitting long periods of time, bending, laying   Pain Relieving Factors heat, massage with ball, stretch   Multiple Pain Sites No            [facility] PT Assessment - 12/31/15 0001      Assessment   Medical Diagnosis SI joint pain   Referring Provider Dr. Timmothy EulerMark Dunomski   Onset Date/Surgical Date 05/04/15   Prior Therapy yes, prior to injection     Precautions   Precautions None     Restrictions   Weight Bearing Restrictions No     Balance Screen   Has the patient fallen in the past 6 months No   Has the patient had a decrease in activity level because of a fear of falling?  No   Is the patient reluctant to leave their home because of a fear of falling?  No     Home [employer]   Living Environment Private residence     Prior Function   Level of Independence Independent     Cognition   Overall Cognitive Status Within Functional Limits for tasks assessed     Observation/Other Assessments   Focus on Therapeutic Outcomes (FOTO)  64% limitation  goal is 39% limitation     Posture/Postural Control   Posture/Postural Control No significant limitations     Strength   Overall Strength Comments right hip IR 4/5, abduction 4/5,      Palpation   SI assessment  right ilium is antteriorly rotated   Palpation comment palpable tenderness located in right gluteals                     [facility] Adult PT Treatment/Exercise - 12/31/15  0001      Lumbar Exercises: Stretches   Active Hamstring Stretch 5 reps   Active Hamstring Stretch Limitations on/off with strap   Piriformis Stretch 5 reps   Piriformis Stretch Limitations LEs on ball,crossed over legs     Lumbar Exercises: Standing   Other Standing Lumbar Exercises squat 15x with pole along spine to keep correct alignment   Other Standing Lumbar Exercises stand on right leg sliding left leg out to side on wash cloth 10x then 10x extending left leg on floor followed by changing legs; Stand on one leg and do the A-Z  on other leg     Lumbar Exercises: Supine   Isometric Hip Flexion 10 reps     Modalities   Modalities Electrical Stimulation;Moist Heat     Moist Heat Therapy   Number Minutes Moist Heat 15 Minutes   Moist Heat Location Lumbar Spine     Electrical Stimulation   Electrical Stimulation Location Bilateral lumbar    Electrical Stimulation Action IFC    Electrical Stimulation Parameters 15 min, to patient tolerance   Electrical Stimulation Goals Pain     Manual Therapy   Manual Therapy Muscle Energy Technique   Muscle Energy Technique correct right ilium in prone                PT Education - 12/31/15 1228    Education provided No          PT Short Term Goals - 12/27/15 0929      PT SHORT TERM GOAL #1   Title pt will be independent in initial HEP   Status Achieved     PT SHORT TERM GOAL #2   Title Pt will increase Rt hip strength to 4/5 to improve ability to work without pain   Status Achieved     PT SHORT TERM GOAL #3   Title Pt will sleep x 5 hours without waking due to pain   Status Achieved           PT Long Term Goals - 12/31/15 1158      PT LONG TERM GOAL #1   Title FOTO will improve to < 39% to demo improved funcitonal mobility   Time 8   Period Weeks   Status On-going     PT LONG TERM GOAL #2   Title pt will report LE pain decrease by 50% in intensity or frequency   Time 8   Period Weeks   Status On-going  since injection last week 50%     PT LONG TERM GOAL #3   Title Pt will sleep through the night 75% of the time without waking due to pain   Time 4   Period Weeks   Status Achieved     PT LONG TERM GOAL #4   Title Pt will be independent in advanced HEP   Baseline 10 minutes, some days less   Time 8   Period Weeks   Status On-going     PT LONG TERM GOAL #6   Title sit for 15 min. to document with minimal to no pain   Time 8   Period Weeks   Status On-going  5 min     PT LONG TERM GOAL #7   Title ability to lift without difficulty at work due to increased core strength   Time 8   Period Weeks   Status On-going  severe difficulty     PT LONG TERM GOAL #8   Title  ability to place full weight on right lower extremity during work tasks due to pelvis in correct alignment and reduction in muscle spasms   Time 8   Period Weeks   Status On-going  50% comfortably     PT LONG  TERM GOAL  #9   TITLE return to dance class due to back pain and right buttock pain decreased >/= 75%   Time 8   Period Weeks   Status On-going  not able to yet               Plan - 12/31/15 1201    Clinical Impression Statement Patient pain does well when using IFC after therapy.  She may benefit from a home TENS unit for pain management.  Right hip abduction and interal rotation strength is 4/5. Patient is only able to sit for 5 min. Patient has severe difficulty to lift a work. Since injection last week her pain in the right leg has decreased by 50%. Patient pelvis was corrected in therapy.  Patient has started stabilization exercises in standing.  Patient will benefit form skilled therapy to improve core stabilization in standing.    Rehab Potential Good   Clinical Impairments Affecting Rehab Potential none   PT Frequency 2x / week   PT Duration 8 weeks   PT Treatment/Interventions Electrical Stimulation;Cryotherapy;Iontophoresis 4mg /ml Dexamethasone;Moist Heat;Traction;Therapeutic activities;Therapeutic exercise;Balance training;Patient/family education;Functional mobility training;Gait training;Neuromuscular re-education;Passive range of motion;Dry needling;Taping;Manual techniques   PT Next Visit Plan resume core, right hip strength with hip in ER;  e-stim/heat as needed   PT Home Exercise Plan physioball exercises   Consulted and Agree with Plan of Care Patient      Patient will benefit from skilled therapeutic intervention in order to improve the following deficits and impairments:  Decreased activity tolerance, Pain, Decreased strength, Impaired flexibility, Increased muscle spasms  Visit Diagnosis: Cramp and spasm  Muscle weakness (generalized)  Bilateral low back pain with sciatica, sciatica laterality unspecified     Problem List There are no active problems to display for this patient.   Eulis Foster, PT 12/31/15 12:32 PM    [facility] [address], [address] [place], [place], [address] Phone: [phone]   Fax:  [phone]  Name: Karen Rowe MRN: [id number] Date of Birth: 1991/02/02

## 2016-01-06 MED FILL — EMOQUETTE 28 DAY TABLET: 0.15-30 | 84 days supply | Qty: 84 | Fill #0

## 2016-01-08 ENCOUNTER — Ambulatory Visit: Payer: PRIVATE HEALTH INSURANCE | Admitting: Physical Therapy

## 2016-01-10 ENCOUNTER — Ambulatory Visit: Payer: PRIVATE HEALTH INSURANCE | Admitting: Physical Therapy

## 2016-01-10 ENCOUNTER — Encounter: Payer: Self-pay | Admitting: Physical Therapy

## 2016-01-10 DIAGNOSIS — R252 Cramp and spasm: Secondary | ICD-10-CM | POA: Diagnosis not present

## 2016-01-10 DIAGNOSIS — R0602 Shortness of breath: Secondary | ICD-10-CM | POA: Diagnosis not present

## 2016-01-10 DIAGNOSIS — M6281 Muscle weakness (generalized): Secondary | ICD-10-CM

## 2016-01-10 DIAGNOSIS — Z0000 Encounter for general adult medical examination without abnormal findings: Secondary | ICD-10-CM | POA: Diagnosis not present

## 2016-01-10 DIAGNOSIS — R5383 Other fatigue: Secondary | ICD-10-CM | POA: Diagnosis not present

## 2016-01-10 DIAGNOSIS — Z114 Encounter for screening for human immunodeficiency virus [HIV]: Secondary | ICD-10-CM | POA: Diagnosis not present

## 2016-01-10 NOTE — Therapy (Addendum)
[facility] [address] [place], [place], [address] Phone: [phone]   Fax:  [phone]  Physical Therapy Treatment  Patient Details  Name: Karen Rowe MRN: [id number] Date of Birth: 1990/12/30 Referring Provider: Dr. Connye Burkitt  Encounter Date: 01/10/2016      PT End of Session - 01/10/16 0938    Visit Number 15   Number of Visits 20   Date for PT Re-Evaluation 02/11/16   Authorization Type W/C   Authorization - Visit Number 15   Authorization - Number of Visits 20   PT Start Time 0930   PT Stop Time 1010   PT Time Calculation (min) 40 min   Activity Tolerance Patient tolerated treatment well   Behavior During Therapy [facility] for tasks assessed/performed      Past Medical History:  Diagnosis Date  . Migraines     History reviewed. No pertinent surgical history.  There were no vitals filed for this visit.      Subjective Assessment - 01/10/16 0938    Subjective I have changed jobs with less lifting. Pain is better and at a 4/10 on regular day.    Limitations Sitting   How long can you sit comfortably? 5 min   Patient Stated Goals be able to sit and lay down without pain, work without pain   Currently in Pain? Yes   Pain Score 3    Pain Location Leg   Pain Orientation Right   Pain Descriptors / Indicators Tightness   Pain Type Chronic pain   Pain Radiating Towards right leg   Pain Onset More than a month ago   Pain Frequency Constant   Aggravating Factors  bending, lifting, pulling in bending position   Pain Relieving Factors heat, stretch, upright position   Multiple Pain Sites No                      Pelvic Floor Special Questions - 01/10/16 0001    Pelvic Floor Internal Exam Patient confirms identification and approves PT to work on pelvic floor muscles   Exam Type Vaginal   Palpation tenderness located in right levator ani and sacrutuberus ligament; left sacrotuberus  ligament and ischiococcygeus   Strength fair squeeze, definite lift           [facility] Adult PT Treatment/Exercise - 01/10/16 0001      Exercises   Other Exercises  pelvic floor contraction and bearing down to work full movement of pelvic floor exercises     Lumbar Exercises: Stretches   Active Hamstring Stretch 5 reps   Active Hamstring Stretch Limitations on/off with strap   Piriformis Stretch 5 reps   Piriformis Stretch Limitations LEs on ball,crossed over legs     Manual Therapy   Manual Therapy Internal Pelvic Floor   Internal Pelvic Floor soft tissue work to bil. coccygeus, bil. puborectalis, bil. ishciocccygeus with hip movements                 PT Education - 01/10/16 1015    Education provided Yes   Education Details pelvic floor massage with finger   Person(s) Educated Patient   Methods Explanation   Comprehension Verbalized understanding          PT Short Term Goals - 12/27/15 0929      PT SHORT TERM GOAL #1   Title pt will be independent in initial HEP   Status Achieved     PT SHORT  TERM GOAL #2   Title Pt will increase Rt hip strength to 4/5 to improve ability to work without pain   Status Achieved     PT SHORT TERM GOAL #3   Title Pt will sleep x 5 hours without waking due to pain   Status Achieved           PT Long Term Goals - 01/10/16 0940      PT LONG TERM GOAL #1   Title FOTO will improve to < 39% to demo improved funcitonal mobility   Time 8   Period Weeks   Status On-going     PT LONG TERM GOAL #2   Title pt will report LE pain decrease by 50% in intensity or frequency   Time 8   Period Weeks   Status Achieved     PT LONG TERM GOAL #3   Title Pt will sleep through the night 75% of the time without waking due to pain   Time 4   Period Weeks   Status Achieved     PT LONG TERM GOAL #4   Title Pt will be independent in advanced HEP   Time 8   Period Weeks   Status On-going     PT LONG TERM GOAL #5   Title Pt will  improve Rt hip strength to 4+/5 to work without pain   Time 8   Period Weeks   Status Achieved     PT LONG TERM GOAL #6   Title sit for 15 min. to document with minimal to no pain   Time 8   Period Weeks   Status On-going  5 min     PT LONG TERM GOAL #7   Title ability to lift without difficulty at work due to increased core strength   Time 8   Period Weeks   Status On-going  had to change jobs due to lifting increasing pain     PT [place] #8   Title ability to place full weight on right lower extremity during work tasks due to pelvis in correct alignment and reduction in muscle spasms   Time 8   Period Weeks   Status On-going  unable to place full weight on right leg     PT LONG TERM GOAL  #9   TITLE return to dance class due to back pain and right buttock pain decreased >/= 75%   Time 8   Period Weeks   Status On-going  not able to                Plan - 01/10/16 1016    Clinical Impression Statement Patient has trigger points in pelvic floor muscles that were released internally. Patient has difficulty relaxing the pelvic floor after contraction.  Patient reports her pain was 90% better after internal soft tissue work. Patient will benefit form skilled therpay to release trigger points and work on strength.    Rehab Potential Good   Clinical Impairments Affecting Rehab Potential none   PT Frequency 2x / week   PT Duration 8 weeks   PT Treatment/Interventions Electrical Stimulation;Cryotherapy;Iontophoresis 40m/ml Dexamethasone;Moist Heat;Traction;Therapeutic activities;Therapeutic exercise;Balance training;Patient/family education;Functional mobility training;Gait training;Neuromuscular re-education;Passive range of motion;Dry needling;Taping;Manual techniques   PT Next Visit Plan resume core, right hip strength with hip in ER;  e-stim/heat as needed; work on pelvic floor muscles   PT Home Exercise Plan progress as needed   Consulted and Agree with Plan of  Care Patient  Patient will benefit from skilled therapeutic intervention in order to improve the following deficits and impairments:  Decreased activity tolerance, Pain, Decreased strength, Impaired flexibility, Increased muscle spasms  Visit Diagnosis: Cramp and spasm  Muscle weakness (generalized)     Problem List There are no active problems to display for this patient.   Earlie Counts, PT 01/10/16 10:19 AM   [facility] [address], [place], [place], [address] Phone: [phone]   Fax:  [phone]  Name: Karen Rowe MRN: [id number] Date of Birth: May 04, 1990  PHYSICAL THERAPY DISCHARGE SUMMARY  Visits from Start of Care: 15  Current functional level related to goals / functional outcomes: See above.   Remaining deficits: See above.    Education / Equipment: HEP  Plan: Patient agrees to discharge.  Patient goals were partially met. Patient is being discharged due to not returning since the last visit. Thank you for the referral. Earlie Counts, PT 02/06/16 12:20 PM   ?????

## 2016-01-23 DIAGNOSIS — N3000 Acute cystitis without hematuria: Secondary | ICD-10-CM | POA: Diagnosis not present

## 2016-01-23 DIAGNOSIS — R300 Dysuria: Secondary | ICD-10-CM | POA: Diagnosis not present

## 2016-01-23 MED FILL — SULFAMETHOXAZOLE/TMP DS TAB: 800-160 | 7 days supply | Qty: 14 | Fill #0

## 2016-01-24 DIAGNOSIS — J309 Allergic rhinitis, unspecified: Secondary | ICD-10-CM | POA: Diagnosis not present

## 2016-01-24 DIAGNOSIS — L900 Lichen sclerosus et atrophicus: Secondary | ICD-10-CM | POA: Diagnosis not present

## 2016-01-24 DIAGNOSIS — E049 Nontoxic goiter, unspecified: Secondary | ICD-10-CM | POA: Diagnosis not present

## 2016-01-24 DIAGNOSIS — G43009 Migraine without aura, not intractable, without status migrainosus: Secondary | ICD-10-CM | POA: Diagnosis not present

## 2016-05-13 DIAGNOSIS — K0500 Acute gingivitis, plaque induced: Secondary | ICD-10-CM | POA: Diagnosis not present

## 2016-05-13 DIAGNOSIS — Z1281 Encounter for screening for malignant neoplasm of oral cavity: Secondary | ICD-10-CM | POA: Diagnosis not present

## 2016-05-13 DIAGNOSIS — Z1289 Encounter for screening for malignant neoplasm of other sites: Secondary | ICD-10-CM | POA: Diagnosis not present

## 2016-10-17 IMAGING — XA DG FLUORO GUIDE SPINAL/SI JT INJ*L*
2 series · 2 of 2 positions shown · non-contrast
Comparison: none

CLINICAL DATA: Bilateral sacroiliitis. Relief of right sacroiliitis
symptoms following previous steroid injection. Recurrent symptoms
following returned to full work load. She now has some left sacro
ileitis symptoms as well.

[Series 1: ortho standard · 1 of 1 slices shown (1 of 2)]
[im 1/1]
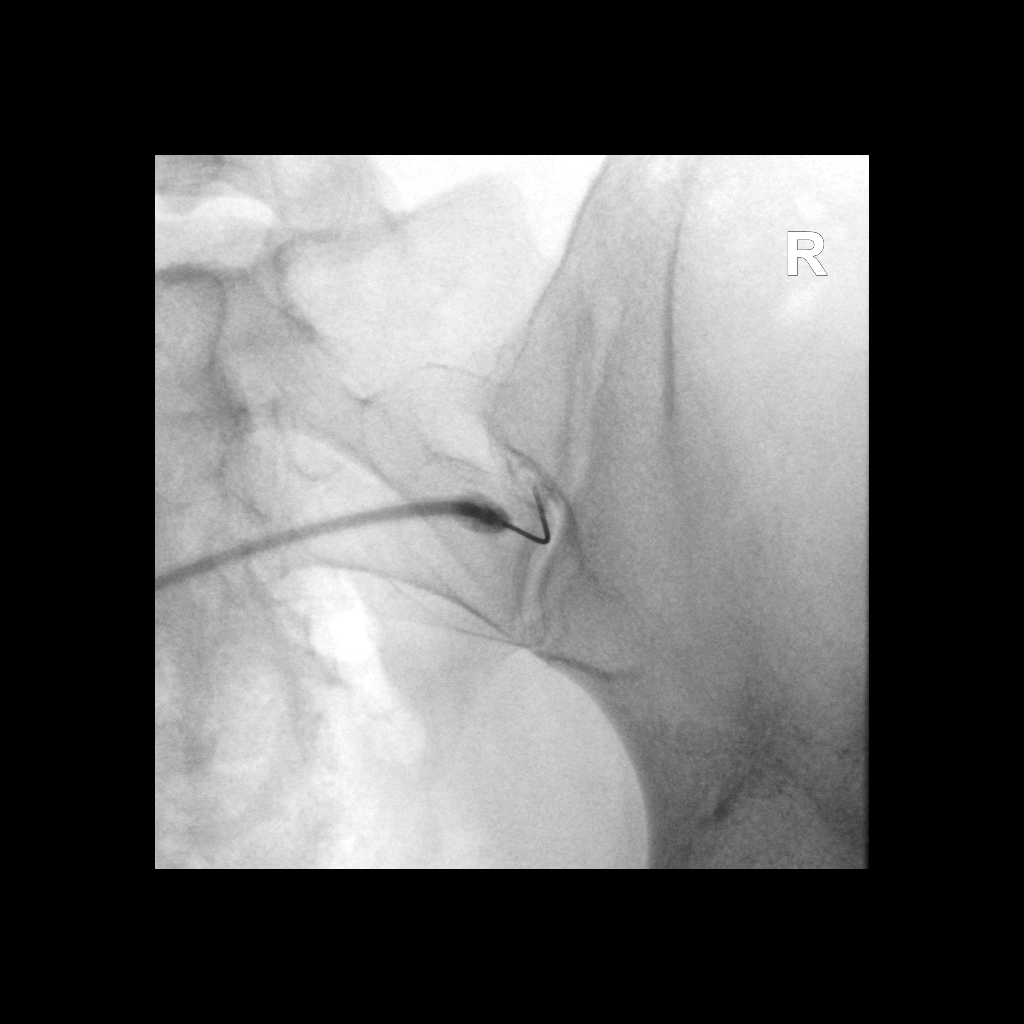

[Series 2: ortho standard · 1 of 1 slices shown (2 of 2)]
[im 1/1]
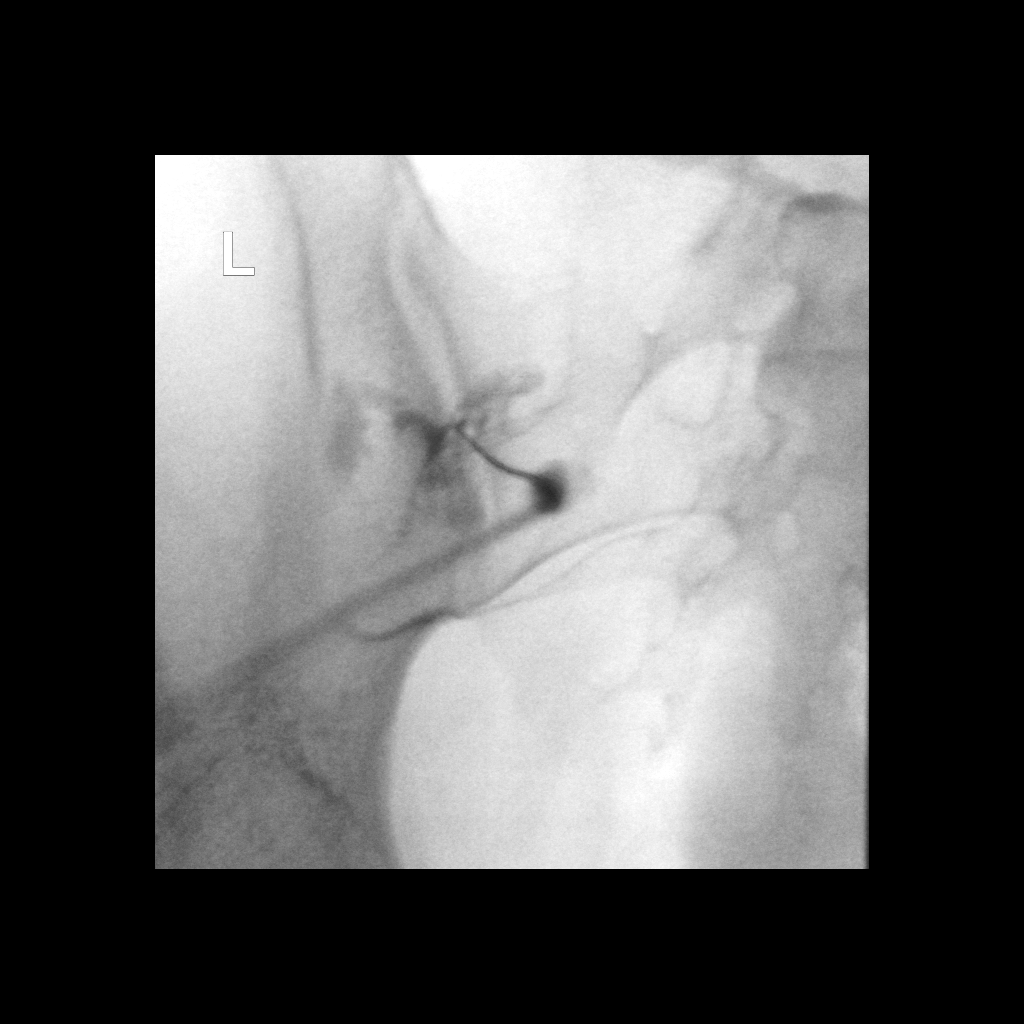

[2 of 2 positions shown; findings below may reference images not displayed]

FLUOROSCOPY TIME:  Radiation Exposure Index (as provided by the
fluoroscopic device): Ceased 2.57 uGy*m2

If the device does not provide the exposure index:

Fluoroscopy Time:  33 seconds

Number of Acquired Images:  0

PROCEDURE:
Bilateral SI JOINT INJECTION.

After a thorough discussion of risks and benefits of the procedure,
including bleeding, infection, injury to nerves, blood vessels, and
adjacent structures, verbal and written consent was obtained.
Specific risks of the procedure included
nondiagnostic/nontherapeutic injection and non target injection. The
patient was placed prone on the fluoroscopy table and localization
was performed over the sacrum. Target site marked using fluoroscopic
guidance. The skin was prepped and draped in the usual sterile
fashion using Betadine soap.

After local anesthesia with 1% lidocaine without epinephrine and
subsequent deep anesthesia, a curved 22 gauge spinal needle was
advanced into the right SI joint. Injection of 0.5 ml Isovue-M 200
confirmed intra-articular placement. No vascular uptake present.
Subsequently, 80 of Depo-Medrol and 1 mL 1% lidocaine was injected
into SI joint.

After local anesthesia with 1% lidocaine without epinephrine and
subsequent deep anesthesia, a curved 22 gauge spinal needle was
advanced into the left SI joint. Injection of 0.5 ml Isovue-M 200
confirmed intra-articular placement. No vascular uptake present.
Subsequently, 40 of Depo-Medrol and 0.5 mL 1% lidocaine was injected
into SI joint.

No complications were observed. The patient was observed and
released under the care of a driver after 30 minutes.
IMPRESSION: Successful fluoroscopically guided bilateral SI joint injection.

80 mg Depo-Medrol injected into the right SI joint and 40 mg
Depo-Medrol into the left SI joint.
